# Patient Record
Sex: Female | Born: 1975 | Race: Asian | Hispanic: No | Marital: Married | Smoking: Never smoker
Health system: Southern US, Community
[De-identification: ages and names within clinical notes are randomized; demographics above are authoritative.]

## PROBLEM LIST (undated history)

## (undated) DIAGNOSIS — M6740 Ganglion, unspecified site: Secondary | ICD-10-CM

## (undated) DIAGNOSIS — T7840XA Allergy, unspecified, initial encounter: Secondary | ICD-10-CM

## (undated) HISTORY — DX: Ganglion, unspecified site: M67.40

## (undated) HISTORY — PX: OTHER SURGICAL HISTORY: SHX169

## (undated) HISTORY — DX: Allergy, unspecified, initial encounter: T78.40XA

---

## 2000-06-20 ENCOUNTER — Encounter: Admission: RE | Admit: 2000-06-20 | Discharge: 2000-06-20 | Payer: Self-pay | Admitting: Family Medicine

## 2000-07-23 ENCOUNTER — Encounter: Admission: RE | Admit: 2000-07-23 | Discharge: 2000-07-23 | Payer: Self-pay | Admitting: Family Medicine

## 2001-05-23 ENCOUNTER — Emergency Department (HOSPITAL_COMMUNITY): Admission: EM | Admit: 2001-05-23 | Discharge: 2001-05-23 | Payer: Self-pay | Admitting: Emergency Medicine

## 2001-06-21 ENCOUNTER — Encounter: Admission: RE | Admit: 2001-06-21 | Discharge: 2001-06-21 | Payer: Self-pay | Admitting: Family Medicine

## 2001-07-23 ENCOUNTER — Encounter: Admission: RE | Admit: 2001-07-23 | Discharge: 2001-07-23 | Payer: Self-pay | Admitting: Family Medicine

## 2002-03-21 ENCOUNTER — Other Ambulatory Visit: Admission: RE | Admit: 2002-03-21 | Discharge: 2002-03-21 | Payer: Self-pay | Admitting: Obstetrics and Gynecology

## 2003-04-17 ENCOUNTER — Other Ambulatory Visit: Admission: RE | Admit: 2003-04-17 | Discharge: 2003-04-17 | Payer: Self-pay | Admitting: Obstetrics and Gynecology

## 2004-05-13 ENCOUNTER — Other Ambulatory Visit: Admission: RE | Admit: 2004-05-13 | Discharge: 2004-05-13 | Payer: Self-pay | Admitting: Obstetrics and Gynecology

## 2006-08-20 ENCOUNTER — Emergency Department (HOSPITAL_COMMUNITY): Admission: EM | Admit: 2006-08-20 | Discharge: 2006-08-20 | Payer: Self-pay | Admitting: Emergency Medicine

## 2006-08-21 ENCOUNTER — Ambulatory Visit (HOSPITAL_COMMUNITY): Admission: RE | Admit: 2006-08-21 | Discharge: 2006-08-21 | Payer: Self-pay | Admitting: Emergency Medicine

## 2006-11-27 ENCOUNTER — Inpatient Hospital Stay (HOSPITAL_COMMUNITY): Admission: AD | Admit: 2006-11-27 | Discharge: 2006-11-27 | Payer: Self-pay | Admitting: Obstetrics & Gynecology

## 2006-12-06 ENCOUNTER — Inpatient Hospital Stay (HOSPITAL_COMMUNITY): Admission: AD | Admit: 2006-12-06 | Discharge: 2006-12-09 | Payer: Self-pay | Admitting: Obstetrics and Gynecology

## 2012-07-18 ENCOUNTER — Telehealth: Payer: Self-pay

## 2012-07-18 ENCOUNTER — Ambulatory Visit (INDEPENDENT_AMBULATORY_CARE_PROVIDER_SITE_OTHER): Payer: BC Managed Care – PPO | Admitting: Family Medicine

## 2012-07-18 VITALS — BP 93/59 | HR 69 | Temp 97.9°F | Resp 18 | Ht 60.0 in | Wt 133.0 lb

## 2012-07-18 DIAGNOSIS — M6740 Ganglion, unspecified site: Secondary | ICD-10-CM

## 2012-07-18 NOTE — Telephone Encounter (Signed)
Please call husband's cell # [phone] with referral info when it is set up.

## 2012-07-18 NOTE — Progress Notes (Signed)
[facility] [address], [place] [address] [phone]  Date:  07/18/2012   Name:  Taylor Sanders   DOB:  [birth year]   MRN:  [id number]  PCP:  No primary provider on file.    Chief Complaint: Cyst   History of Present Illness:  Taylor Sanders is a 36 y.o. very pleasant female patient who presents with the following:  She has noted a ganglion cyst on her left dorsal wrist for several months.  This was present in the past and went away- however it has returned.  There is no pain, no other issues today.    There is no problem list on file for this patient.   No past medical history on file.  No past surgical history on file.  History  Substance Use Topics  . Smoking status: Never Smoker   . Smokeless tobacco: Not on file  . Alcohol Use: Not on file    No family history on file.  No Known Allergies  Medication list has been reviewed and updated.  Current Outpatient Prescriptions on File Prior to Visit  Medication Sig Dispense Refill  . norethindrone-ethinyl estradiol (MICROGESTIN,JUNEL,LOESTRIN) 1-20 MG-MCG tablet Take 1 tablet by mouth daily.        Review of Systems:  As per HPI- otherwise negative.   Physical Examination: Filed Vitals:   07/18/12 1244  BP: 93/59  Pulse: 69  Temp: 97.9 F (36.6 C)  Resp: 18   Filed Vitals:   07/18/12 1244  Height: 5' (1.524 m)  Weight: 133 lb (60.328 kg)   Body mass index is 25.97 kg/(m^2). Ideal Body Weight: Weight in (lb) to have BMI = 25: 127.7    GEN: WDWN, NAD, Non-toxic, Alert & Oriented x 3 HEENT: Atraumatic, Normocephalic.  Ears and Nose: No external deformity. EXTR: No clubbing/cyanosis/edema NEURO: Normal gait.  PSYCH: Normally interactive. Conversant. Not depressed or anxious appearing.  Calm demeanor.  Left dorsal wrist: medium sized ganglion cyst, about 2cm in diameter.  Firm, mobile- typical ganglion.  No heat or redness, good ROM, no tenderness.    Assessment and Plan: 1.  Ganglion cyst    Referral to ortho/ sports med for treatment of cyst.  Did not charge for today's visit.  Will make referral for her.   Abbe Amsterdam, MD

## 2012-09-03 ENCOUNTER — Ambulatory Visit (INDEPENDENT_AMBULATORY_CARE_PROVIDER_SITE_OTHER): Payer: BC Managed Care – PPO | Admitting: Family Medicine

## 2012-09-03 VITALS — BP 92/60 | HR 55 | Temp 97.8°F | Resp 16 | Ht 60.0 in | Wt 123.0 lb

## 2012-09-03 DIAGNOSIS — M549 Dorsalgia, unspecified: Secondary | ICD-10-CM

## 2012-09-03 DIAGNOSIS — O2390 Unspecified genitourinary tract infection in pregnancy, unspecified trimester: Secondary | ICD-10-CM

## 2012-09-03 DIAGNOSIS — R350 Frequency of micturition: Secondary | ICD-10-CM

## 2012-09-03 DIAGNOSIS — O2340 Unspecified infection of urinary tract in pregnancy, unspecified trimester: Secondary | ICD-10-CM

## 2012-09-03 DIAGNOSIS — N390 Urinary tract infection, site not specified: Secondary | ICD-10-CM

## 2012-09-03 LAB — POCT UA - MICROSCOPIC ONLY
Casts, Ur, LPF, POC: NEGATIVE
Crystals, Ur, HPF, POC: NEGATIVE
Mucus, UA: NEGATIVE
Yeast, UA: NEGATIVE

## 2012-09-03 LAB — POCT URINALYSIS DIPSTICK
Bilirubin, UA: NEGATIVE
Blood, UA: NEGATIVE
Glucose, UA: NEGATIVE
Ketones, UA: NEGATIVE
Nitrite, UA: NEGATIVE
Protein, UA: NEGATIVE
Spec Grav, UA: 1.015
Urobilinogen, UA: 0.2
pH, UA: 7.5

## 2012-09-03 MED ORDER — NITROFURANTOIN MONOHYD MACRO 100 MG PO CAPS: 100.0000 mg | ORAL_CAPSULE | Freq: Two times a day (BID) | ORAL | Status: DC

## 2012-09-03 NOTE — Progress Notes (Signed)
 [facility]:  Office Visit  Chief Complaint:  Chief Complaint  Patient presents with  . Back Pain    x 3 days  . Urinary Frequency    HPI: Taylor Sanders is a 36 y.o. female who complains of 3 days of LBP bilaterally especially with bending over  No dysuria. Vaginal itching. + Urinary retention.   Past Medical History  Diagnosis Date  . Ganglion cyst    Past Surgical History  Procedure Date  . Ganglion cyst aspiration    History   Social History  . Marital Status: Married    Spouse Name: N/A    Number of Children: N/A  . Years of Education: N/A   Social History Main Topics  . Smoking status: Never Smoker   . Smokeless tobacco: None  . Alcohol Use: No  . Drug Use: No  . Sexually Active: None   Other Topics Concern  . None   Social History Narrative  . None   Family History  Problem Relation Age of Onset  . Arthritis Mother    No Known Allergies Prior to Admission medications   Medication Sig Start Date End Date Taking? Authorizing Provider  norethindrone-ethinyl estradiol (MICROGESTIN,JUNEL,LOESTRIN) 1-20 MG-MCG tablet Take 1 tablet by mouth daily.    Historical Provider, MD     ROS: The patient denies fevers, chills, night sweats, unintentional weight loss, chest pain, palpitations, wheezing, dyspnea on exertion, nausea, vomiting, abdominal pain, dysuria, hematuria, melena, numbness, weakness, or tingling.   All other systems have been reviewed and were otherwise negative with the exception of those mentioned in the HPI and as above.    PHYSICAL EXAM: Filed Vitals:   09/03/12 0814  BP: 92/60  Pulse: 55  Temp: 97.8 F (36.6 C)  Resp: 16   Filed Vitals:   09/03/12 0814  Height: 5' (1.524 m)  Weight: 123 lb (55.792 kg)   Body mass index is 24.02 kg/(m^2).  General: Alert, no acute distress HEENT:  Normocephalic, atraumatic, oropharynx patent.  Cardiovascular:  Regular rate and rhythm, no rubs murmurs or gallops.  No Carotid  bruits, radial pulse intact. No pedal edema.  Respiratory: Clear to auscultation bilaterally.  No wheezes, rales, or rhonchi.  No cyanosis, no use of accessory musculature GI: No organomegaly, abdomen is soft and non-tender, positive bowel sounds.  No masses. Skin: No rashes. Neurologic: Facial musculature symmetric. Psychiatric: Patient is appropriate throughout our interaction. Lymphatic: No cervical lymphadenopathy Musculoskeletal: Gait intact. Full ROM. 5/5 strn, sensationitnact, 2/2 DTR. No CVA tenderness   LABS: Results for orders placed in visit on 09/03/12  POCT URINALYSIS DIPSTICK      Component Value Range   Color, UA yellow     Clarity, UA hazy     Glucose, UA neg     Bilirubin, UA neg     Ketones, UA neg     Spec Grav, UA 1.015     Blood, UA neg     pH, UA 7.5     Protein, UA neg     Urobilinogen, UA 0.2     Nitrite, UA neg     Leukocytes, UA Trace    POCT UA - MICROSCOPIC ONLY      Component Value Range   WBC, Ur, HPF, POC 4-6     RBC, urine, microscopic 1-3     Bacteria, U Microscopic trace     Mucus, UA neg     Epithelial cells, urine per micros 2-3  Crystals, Ur, HPF, POC neg     Casts, Ur, LPF, POC neg     Yeast, UA neg       EKG/XRAY:   Primary read interpreted by Dr. Conley Rolls at [facility].   ASSESSMENT/PLAN: Encounter Diagnoses  Name Primary?  . Urinary frequency Yes  . Back pain    UTI-Rx Macrobid, Culture urine Push fluids If sxs do not improve with abx then consider other cause of back pain which may bot be related to UTI. She works as a Lawyer so maybe relaed to that.    ,  PHUONG, DO 09/03/2012 9:08 AM

## 2012-09-05 LAB — URINE CULTURE: Colony Count: 5000

## 2013-03-27 ENCOUNTER — Ambulatory Visit (INDEPENDENT_AMBULATORY_CARE_PROVIDER_SITE_OTHER): Payer: BC Managed Care – PPO | Admitting: Family Medicine

## 2013-03-27 VITALS — BP 96/60 | HR 69 | Temp 98.4°F | Resp 16 | Ht 60.0 in | Wt 137.0 lb

## 2013-03-27 DIAGNOSIS — M62838 Other muscle spasm: Secondary | ICD-10-CM

## 2013-03-27 DIAGNOSIS — M25519 Pain in unspecified shoulder: Secondary | ICD-10-CM

## 2013-03-27 DIAGNOSIS — M542 Cervicalgia: Secondary | ICD-10-CM

## 2013-03-27 DIAGNOSIS — M25511 Pain in right shoulder: Secondary | ICD-10-CM

## 2013-03-27 MED ORDER — MELOXICAM 7.5 MG PO TABS: 7.5000 mg | ORAL_TABLET | Freq: Every day | ORAL | Status: DC

## 2013-03-27 MED ORDER — CYCLOBENZAPRINE HCL 5 MG PO TABS: ORAL_TABLET | ORAL | Status: DC

## 2013-03-27 NOTE — Progress Notes (Signed)
Subjective:    Patient ID: Taylor Sanders, female    DOB: 1975-12-30, 37 y.o.   MRN: [id number]  HPI Taylor Sanders is a 37 y.o. female Started 6 days ago with R shoulder pain - neck to down R arm.  Initially slight soreness - 6 days ago.  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  Same job past year. Pain radiated down arm few days ago.  Radiated down arm 2 days ago. Only in shoulder today. No dropping of objects, no weakness.   Tx: tylenol, then alleve once.    Review of Systems  Constitutional: Negative for fever and chills.  Musculoskeletal: Positive for myalgias and arthralgias. Negative for joint swelling.  Skin: Negative for color change and rash.  Neurological: Negative for weakness.       Objective:   Physical Exam  Vitals reviewed. Constitutional: She is oriented to person, place, and time. She appears well-developed and well-nourished.  Pulmonary/Chest: Effort normal.  Musculoskeletal:       Right shoulder: She exhibits normal range of motion, no tenderness, no bony tenderness and normal strength (full rtc stregth, negative empty can,, neer, hawkins kennedy. ).       Cervical back: She exhibits decreased range of motion (decreased extension and L rotation - l rotation and lateral flexion reproduces neck sx's.  ), tenderness and spasm (L paraspinal  and trapezius spasm. ). She exhibits no swelling.       Back:  Neurological: She is alert and oriented to person, place, and time. She has normal strength. No sensory deficit.  Reflex Scores:      Tricep reflexes are 2+ on the right side and 2+ on the left side.      Bicep reflexes are 2+ on the right side and 2+ on the left side.      Brachioradialis reflexes are 2+ on the right side and 2+ on the left side. Full/equal strength of UE's bilaterally. nvi distally.  Strength intact in hand.   Skin: Skin is warm and dry. No rash noted.  Psychiatric:  She has a normal mood and affect. Her behavior is normal.      Assessment & Plan:  Taylor Sanders is a 37 y.o. female Neck pain - Plan: cyclobenzaprine (FLEXERIL) 5 MG tablet, meloxicam (MOBIC) 7.5 MG tablet  Pain in joint, shoulder region, right - Plan: meloxicam (MOBIC) 7.5 MG tablet  Muscle spasms of neck - Plan: cyclobenzaprine (FLEXERIL) 5 MG tablet, meloxicam (MOBIC) 7.5 MG tablet  likely spasm of R paraspinals vs HNP.  Cervicogenic source likely (?C7), with repetitive activity at work.  Nki. Deferred xray at present.  Strength intact distally. Trial of mobic, flexeril qhs prn - sed, neck care manual, adjust to using l arm at work and recheck in next week if not improving. understanding expressed.   Meds ordered this encounter           . cyclobenzaprine (FLEXERIL) 5 MG tablet    Sig: 1 pill by mouth up to every 8 hours as needed. Start with one pill by mouth each bedtime as needed due to sedation    Dispense:  15 tablet    Refill:  0  . meloxicam (MOBIC) 7.5 MG tablet    Sig: Take 1 tablet (7.5 mg total) by mouth daily.    Dispense:  30 tablet    Refill:  0     Patient Instructions  Heat or ice to sore area, stretches as in neck care manual. Try to use other hand/arm with work. meloxicam once per day, then muscle relaxant at night if needed. Ok to take tylenol as well if needed. If not improving in next week or worse sooner - return for recheck.

## 2013-03-27 NOTE — Patient Instructions (Addendum)
Heat or ice to sore area, stretches as in neck care manual. Try to use other hand/arm with work. meloxicam once per day, then muscle relaxant at night if needed. Ok to take tylenol as well if needed. If not improving in next week or worse sooner - return for recheck.

## 2013-04-23 ENCOUNTER — Other Ambulatory Visit: Payer: Self-pay | Admitting: Family Medicine

## 2013-04-23 DIAGNOSIS — M542 Cervicalgia: Secondary | ICD-10-CM

## 2013-04-23 NOTE — Telephone Encounter (Signed)
Dr Neva Seat, do you want to give pt any RFs of Mobic?

## 2013-04-24 NOTE — Telephone Encounter (Signed)
LMOM on home # that #15 tabs were sent to pharmacy but needs OV.

## 2013-04-24 NOTE — Telephone Encounter (Signed)
#  15 given, but last seen in April, and instructed to follow up in 1 week if not improving.  If still symptomatic - needs to follow up to recheck.

## 2013-05-18 ENCOUNTER — Other Ambulatory Visit: Payer: Self-pay | Admitting: Family Medicine

## 2013-05-18 DIAGNOSIS — M542 Cervicalgia: Secondary | ICD-10-CM

## 2013-05-19 NOTE — Telephone Encounter (Signed)
Dr Neva Seat, do you want to RF this, or see pt first for re-eval?

## 2013-05-22 NOTE — Telephone Encounter (Signed)
Notified Rx was sent and needs to RTC if more needed to re-eval.

## 2013-05-22 NOTE — Telephone Encounter (Signed)
Call and check status.  I last saw her in April and plan to follow up in 1 week at that time if not improving. If still having pain, can refill Mobic (already sent in), but needs recheck ov to determine course.

## 2014-06-05 ENCOUNTER — Ambulatory Visit (INDEPENDENT_AMBULATORY_CARE_PROVIDER_SITE_OTHER): Payer: PRIVATE HEALTH INSURANCE | Admitting: Family Medicine

## 2014-06-05 VITALS — BP 118/70 | HR 60 | Temp 98.1°F | Resp 16 | Ht 59.75 in | Wt 137.6 lb

## 2014-06-05 DIAGNOSIS — B070 Plantar wart: Secondary | ICD-10-CM

## 2014-06-05 NOTE — Progress Notes (Signed)
° °  Subjective:    Patient ID: Taylor Sanders, female    DOB: 08/17/1976, 38 y.o.   MRN: [id number]  Foot Injury    Chief Complaint  Patient presents with   Foot Injury    bottom of right callus   This chart was scribed for Elvina SidleKurt Lauenstein, MD by Andrew Auaven Small, ED Scribe. This patient was seen in room 10 and the patient's care was started at 4:06 PM.  HPI Comments: Taylor Sanders is a 38 y.o. female who presents to the [facility] complaining of worsening callus to bottom of right foot x over 2 month. Pt has pain walking feeling as if something is pinching her foot. States she has been picking at area, making a hole in callus to alleviate pressure.   There are no active problems to display for this patient.  Past Medical History  Diagnosis Date   Ganglion cyst    No Known Allergies Prior to Admission medications   Medication Sig Start Date End Date Taking? Authorizing Provider  cyclobenzaprine (FLEXERIL) 5 MG tablet 1 pill by mouth up to every 8 hours as needed. Start with one pill by mouth each bedtime as needed due to sedation 03/27/13   Shade FloodJeffrey R Greene, MD  loratadine (CLARITIN) 10 MG tablet Take 10 mg by mouth daily.    Historical Provider, MD  meloxicam (MOBIC) 7.5 MG tablet TAKE 1 TABLET BY MOUTH DAILY 05/18/13   Shade FloodJeffrey R Greene, MD  nitrofurantoin, macrocrystal-monohydrate, (MACROBID) 100 MG capsule Take 1 capsule (100 mg total) by mouth 2 (two) times daily. 09/03/12   Thao P Le, DO  norethindrone-ethinyl estradiol (MICROGESTIN,JUNEL,LOESTRIN) 1-20 MG-MCG tablet Take 1 tablet by mouth daily.    Historical Provider, MD    Review of Systems  Skin: Negative for rash and wound.       Objective:   Physical Exam  Nursing note and vitals reviewed. Constitutional: She is oriented to person, place, and time. She appears well-developed and well-nourished. No distress.  HENT:  Head: Normocephalic and atraumatic.  Eyes: Conjunctivae and EOM are normal.  Neck: Neck  supple. No tracheal deviation present.  Cardiovascular: Normal rate.   Pulmonary/Chest: Effort normal. No respiratory distress.  Musculoskeletal: Normal range of motion.  Neurological: She is alert and oriented to person, place, and time.  Skin: Skin is warm and dry.  .5 cm planter wart to bottom of right foot.  Psychiatric: She has a normal mood and affect. Her behavior is normal.  The wart was carefully debrided after sterile alcohol prep and then liquid N2 was used to treat the wart     Assessment & Plan:   Plantar wart of right foot Recheck 2 week if not resolved Signed, Elvina SidleKurt Lauenstein, MD

## 2015-02-15 ENCOUNTER — Ambulatory Visit (INDEPENDENT_AMBULATORY_CARE_PROVIDER_SITE_OTHER): Payer: BLUE CROSS/BLUE SHIELD | Admitting: Internal Medicine

## 2015-02-15 VITALS — BP 90/70 | HR 82 | Temp 99.8°F | Resp 18 | Wt 137.0 lb

## 2015-02-15 DIAGNOSIS — J111 Influenza due to unidentified influenza virus with other respiratory manifestations: Secondary | ICD-10-CM | POA: Diagnosis not present

## 2015-02-15 MED ORDER — HYDROCODONE-HOMATROPINE 5-1.5 MG/5ML PO SYRP: 5.0000 mL | ORAL_SOLUTION | Freq: Four times a day (QID) | ORAL | Status: DC | PRN

## 2015-02-15 NOTE — Progress Notes (Signed)
This chart was scribed for [name], MD by Luisa Dago, ED Scribe. This patient was seen in room 5 and the patient's care was started at 8:40 PM.  Subjective:    Patient ID: Taylor Sanders, female    DOB: 03-30-1976, 39 y.o.   MRN: [id number]  Chief Complaint  Patient presents with  . Sinusitis    HPI Taylor Sanders is a 39 y.o. female with no pertinent medical history noted below, presents to the office complaining of sudden onset sinusitis that started approximately 3 days ago. Pt is also complaining of associated sinus pressure, HA, subjective fever, chills, cough, sore throat, and congestion. Current office temperature was measured to be 99.8. She endorses multiple sick contacts, Husband and daughter. Husband states that he was not seen by his provider when her symptoms started, but he is getting better. Daughter is recovering as well. She endorses doing a couple of nebulizer treatments at home and using her albuterol inhaler with not relief. Pt also admits to several episodes of diarrhea yesterday. Pt denies any neck pain, otalgia, visual disturbance, CP, SOB, abdominal pain, nausea, emesis,  urinary symptoms, back pain, weakness, numbness and rash as associated symptoms.    There are no active problems to display for this patient.  Past Medical History  Diagnosis Date  . Ganglion cyst    Past Surgical History  Procedure Laterality Date  . Ganglion cyst aspiration     No Known Allergies Prior to Admission medications   Not on File   History   Social History  . Marital Status: Married    Spouse Name: Taylor Sanders  . Number of Children: Taylor Sanders  . Years of Education: Taylor Sanders   Occupational History  . Not on file.   Social History Main Topics  . Smoking status: Never Smoker   . Smokeless tobacco: Not on file  . Alcohol Use: No  . Drug Use: No  . Sexual Activity: Not on file   Other Topics Concern  . Not on file   Social History Narrative   Review of Systems  Constitutional:  Positive for fever and chills.  HENT: Positive for congestion and sore throat. Negative for ear pain, rhinorrhea, sinus pressure, trouble swallowing and voice change.   Eyes: Negative for discharge.  Respiratory: Positive for cough. Negative for shortness of breath, wheezing and stridor.   Cardiovascular: Negative for chest pain.  Gastrointestinal: Positive for diarrhea. Negative for nausea, vomiting and abdominal pain.  Genitourinary: Negative.  Negative for dysuria and hematuria.  Skin: Negative for rash.  Neurological: Positive for headaches. Negative for dizziness, weakness, light-headedness and numbness.   Objective:   Physical Exam  Constitutional: She is oriented to person, place, and time. She appears well-developed and well-nourished. No distress.  HENT:  Head: Normocephalic and atraumatic.  Right Ear: External ear normal.  Left Ear: External ear normal.  Nose: Nose normal.  Mouth/Throat: Oropharynx is clear and moist.  Eyes: Conjunctivae and EOM are normal. Pupils are equal, round, and reactive to light.  Neck: Normal range of motion. Neck supple. No thyromegaly present.  Cardiovascular: Normal rate and regular rhythm.  Exam reveals no gallop and no friction rub.   No murmur heard. Pulmonary/Chest: Effort normal and breath sounds normal. No respiratory distress. She has no wheezes. She has no rales.  Musculoskeletal: Normal range of motion.  Neurological: She is alert and oriented to person, place, and time.  Skin: Skin is warm and dry.  Psychiatric: She has a normal mood and affect.  Her behavior is normal.  Nursing note and vitals reviewed.  BP 90/70 mmHg  Pulse 82  Temp(Src) 99.8 F (37.7 C) (Oral)  Resp 18  Wt 137 lb (62.143 kg)  SpO2 100%  LMP 01/27/2015   Assessment & Plan:  Influenza Meds ordered this encounter  Medications  . HYDROcodone-homatropine (HYCODAN) 5-1.5 MG/5ML syrup    Sig: Take 5 mLs by mouth every 6 (six) hours as needed.    Dispense:  120  mL    Refill:  0   Sudafed 12 hour twice a day 3 days Tylenol Fluid and bedrest   I have completed the patient encounter in its entirety as documented by the scribe, with editing by me where necessary. Elieser Tetrick P. Merla Richesoolittle, M.D.

## 2015-02-15 NOTE — Patient Instructions (Signed)
Ask the pharmacist for 12 hour Sudafed to take twice a day for the next 3-4 days Tylenol or Advil for fever Bedrest and fluids until well

## 2015-08-02 ENCOUNTER — Ambulatory Visit (INDEPENDENT_AMBULATORY_CARE_PROVIDER_SITE_OTHER): Payer: BLUE CROSS/BLUE SHIELD

## 2015-08-02 ENCOUNTER — Ambulatory Visit (INDEPENDENT_AMBULATORY_CARE_PROVIDER_SITE_OTHER): Payer: BLUE CROSS/BLUE SHIELD | Admitting: Family Medicine

## 2015-08-02 VITALS — BP 110/70 | HR 62 | Temp 98.5°F | Ht 59.5 in | Wt 139.5 lb

## 2015-08-02 DIAGNOSIS — H11001 Unspecified pterygium of right eye: Secondary | ICD-10-CM | POA: Diagnosis not present

## 2015-08-02 DIAGNOSIS — M25531 Pain in right wrist: Secondary | ICD-10-CM | POA: Diagnosis not present

## 2015-08-02 DIAGNOSIS — G8929 Other chronic pain: Secondary | ICD-10-CM

## 2015-08-02 MED ORDER — DICLOFENAC SODIUM 75 MG PO TBEC: 75.0000 mg | DELAYED_RELEASE_TABLET | Freq: Two times a day (BID) | ORAL | Status: DC

## 2015-08-02 NOTE — Progress Notes (Signed)
  Subjective:  Patient ID: Taylor Sanders, female    DOB: [birth year]  Age: 39 y.o. MRN: [id number]  Patient is here with 2 problems. Her right wrist has been hurting her. She works at [employer] all day and her wrists is been paining a lot. She has had some pain in the past, but lately it is been bothering her much more.  She also has a pterygium on her right eye which causes a little dryness and irritation. Would like some eye ointment again for that. She has seen an eye doctor in the past, Dr. Hazle Quant.   Objective:   No major distress. Prominent pterygium right eye, not inflamed looking  Right wrist is tender right at the midportion of the wrist joint. No real pain with flexion or extension or gripping right now.  [facility] reading (PRIMARY) by  Dr. Alwyn Ren Normal wrist.    Assessment & Plan:   Assessment:  Chronic pain/strain causing acute and chronic wrist pain Pterygium  Plan:  Wrist splint See instructions about eyes  Patient Instructions  Try using some over-the-counter Refresh Lacri-Lube or Refresh Liquigel for the dry eyes. This is called a pterygium, and if it gets too much trouble you can go back and see the eye doctor or we will refer you if needed.  Wear the wrist splint  Take the diclofenac one twice daily for pain and inflammation  Ask your boss if there is some other job you could do for a few days to let you're wrist to rest  If not improving over the next couple of weeks please come back    Taylor Breeding, MD 08/02/2015

## 2015-08-02 NOTE — Patient Instructions (Addendum)
Try using some over-the-counter Refresh Lacri-Lube or Refresh Liquigel for the dry eyes. This is called a pterygium, and if it gets too much trouble you can go back and see the eye doctor or we will refer you if needed.  Wear the wrist splint  Take the diclofenac one twice daily for pain and inflammation  Ask your boss if there is some other job you could do for a few days to let you're wrist to rest  If not improving over the next couple of weeks please come back

## 2016-02-18 ENCOUNTER — Ambulatory Visit (INDEPENDENT_AMBULATORY_CARE_PROVIDER_SITE_OTHER): Payer: BLUE CROSS/BLUE SHIELD | Admitting: Family Medicine

## 2016-02-18 VITALS — BP 102/70 | HR 86 | Temp 98.6°F | Resp 16 | Ht 59.0 in | Wt 141.8 lb

## 2016-02-18 DIAGNOSIS — R059 Cough, unspecified: Secondary | ICD-10-CM

## 2016-02-18 DIAGNOSIS — R0981 Nasal congestion: Secondary | ICD-10-CM | POA: Diagnosis not present

## 2016-02-18 DIAGNOSIS — R05 Cough: Secondary | ICD-10-CM | POA: Diagnosis not present

## 2016-02-18 LAB — POCT INFLUENZA A/B
INFLUENZA A, POC: NEGATIVE
Influenza B, POC: NEGATIVE

## 2016-02-18 NOTE — Progress Notes (Signed)
Subjective:    Patient ID: Taylor Sanders, female    DOB: [birth year], 40 y.o.   MRN: [id number]  HPI Taylor Sanders is a 40 y.o. female  Burning in eyes, nasal congestion and runny nose, cough, eyes watering.   Felt warm last night and today, but did not measure a fever. No flu vaccine this season. No known sick contacts.  Itching in R ear.   Attempted treatment - claritin last night and theraflu - no relief.   No body aches, no headache.   Not usually allergy sufferer - occasional allergy symptoms.   There are no active problems to display for this patient.  Past Medical History  Diagnosis Date  . Ganglion cyst   . Allergy    Past Surgical History  Procedure Laterality Date  . Ganglion cyst aspiration     No Known Allergies Prior to Admission medications   Medication Sig Start Date End Date Taking? Authorizing Provider  diclofenac (VOLTAREN) 75 MG EC tablet Take 1 tablet (75 mg total) by mouth 2 (two) times daily. Patient not taking: Reported on 02/18/2016 08/02/15   Peyton Najjar, MD  HYDROcodone-homatropine [facility]) 5-1.5 MG/5ML syrup Take 5 mLs by mouth every 6 (six) hours as needed. Patient not taking: Reported on 08/02/2015 02/15/15   Tonye Pearson, MD   Social History   Social History  . Marital Status: Married    Spouse Name: N/A  . Number of Children: N/A  . Years of Education: N/A   Occupational History  . Not on file.   Social History Main Topics  . Smoking status: Never Smoker   . Smokeless tobacco: Not on file  . Alcohol Use: No  . Drug Use: No  . Sexual Activity: Not on file   Other Topics Concern  . Not on file   Social History Narrative      Review of Systems  Constitutional: Negative for fever (objective warm sensation only no objective fever.) and chills.  HENT: Positive for congestion, ear pain (Slight right discomfort) and rhinorrhea.   Eyes: Positive for discharge and itching. Negative for visual disturbance.  Respiratory: Positive for  cough. Negative for shortness of breath.   Musculoskeletal: Negative for myalgias and arthralgias.       Objective:   Physical Exam  Constitutional: She is oriented to person, place, and time. She appears well-developed and well-nourished. No distress.  HENT:  Head: Normocephalic and atraumatic.  Right Ear: Hearing, tympanic membrane, external ear and ear canal normal.  Left Ear: Hearing, tympanic membrane, external ear and ear canal normal.  Nose: Nose normal.  Mouth/Throat: Oropharynx is clear and moist. No oropharyngeal exudate.  Slight edematous turbinates bilaterally  Eyes: Conjunctivae and EOM are normal. Pupils are equal, round, and reactive to light.  Cardiovascular: Normal rate, regular rhythm, normal heart sounds and intact distal pulses.   No murmur heard. Pulmonary/Chest: Effort normal and breath sounds normal. No respiratory distress. She has no wheezes. She has no rhonchi.  Neurological: She is alert and oriented to person, place, and time.  Skin: Skin is warm and dry. No rash noted.  Psychiatric: She has a normal mood and affect. Her behavior is normal.  Vitals reviewed.  Filed Vitals:   02/18/16 1852  BP: 102/70  Pulse: 86  Temp: 98.6 F (37 C)  TempSrc: Oral  Resp: 16  Height:  (1.499 m)  Weight: 141 lb 12.8 oz (64.32 kg)  SpO2: 99%       Assessment &  Plan:  Taylor Sanders is a 40 y.o. female Cough - Plan: POCT Influenza A/B  Nasal congestion - Plan: POCT Influenza A/B  Likely allergic rhinitis, less likely upper respiratory infection/viral infection.    - trial of flonase ns, claritin otc, sx care and rtc precautions.   No orders of the defined types were placed in this encounter.   Patient Instructions  IF you received an x-ray today, you will receive an invoice from [facility]. Please contact [facility] at [phone] with questions or concerns regarding your invoice.   IF you received labwork today, you will receive an  invoice from [employer]. Please contact [facility] at [phone] with questions or concerns regarding your invoice.   Our billing staff will not be able to assist you with questions regarding bills from these companies.  You will be contacted with the lab results as soon as they are available. The fastest way to get your results is to activate your My Chart account. Instructions are located on the last page of this paperwork. If you have not heard from [place] regarding the results in 2 weeks, please contact this office.   Your flu test was negative, your symptoms appear to be due either to a virus, or more likely allergies with the burning eyes and sneezing.   For allergies, see information below, but you can take Flonase nasal spray over-the-counter 1-2 sprays per nostril each day, and continue Claritin once per day.  You can take Mucinex as needed for cough, drink plenty of fluids and rest. Return to the clinic or go to the nearest emergency room if any of your symptoms worsen or new symptoms occur.   Allergic Rhinitis Allergic rhinitis is when the mucous membranes in the nose respond to allergens. Allergens are particles in the air that cause your body to have an allergic reaction. This causes you to release allergic antibodies. Through a chain of events, these eventually cause you to release histamine into the blood stream. Although meant to protect the body, it is this release of histamine that causes your discomfort, such as frequent sneezing, congestion, and an itchy, runny nose.  CAUSES Seasonal allergic rhinitis (hay fever) is caused by pollen allergens that may come from grasses, trees, and weeds. Year-round allergic rhinitis (perennial allergic rhinitis) is caused by allergens such as house dust mites, pet dander, and mold spores. SYMPTOMS  Nasal stuffiness (congestion).  Itchy, runny nose with sneezing and tearing of the eyes. DIAGNOSIS Your health care  provider can help you determine the allergen or allergens that trigger your symptoms. If you and your health care provider are unable to determine the allergen, skin or blood testing may be used. Your health care provider will diagnose your condition after taking your health history and performing a physical exam. Your health care provider may assess you for other related conditions, such as asthma, pink eye, or an ear infection. TREATMENT Allergic rhinitis does not have a cure, but it can be controlled by:  Medicines that block allergy symptoms. These may include allergy shots, nasal sprays, and oral antihistamines.  Avoiding the allergen. Hay fever may often be treated with antihistamines in pill or nasal spray forms. Antihistamines block the effects of histamine. There are over-the-counter medicines that may help with nasal congestion and swelling around the eyes. Check with your health care provider before taking or giving this medicine. If avoiding the allergen or the medicine prescribed do not work, there are many new medicines your health  care provider can prescribe. Stronger medicine may be used if initial measures are ineffective. Desensitizing injections can be used if medicine and avoidance does not work. Desensitization is when a patient is given ongoing shots until the body becomes less sensitive to the allergen. Make sure you follow up with your health care provider if problems continue. HOME CARE INSTRUCTIONS It is not possible to completely avoid allergens, but you can reduce your symptoms by taking steps to limit your exposure to them. It helps to know exactly what you are allergic to so that you can avoid your specific triggers. SEEK MEDICAL CARE IF:  You have a fever.  You develop a cough that does not stop easily (persistent).  You have shortness of breath.  You start wheezing.  Symptoms interfere with normal daily activities.   This information is not intended to replace  advice given to you by your health care provider. Make sure you discuss any questions you have with your health care provider.   Document Released: 08/22/2001 Document Revised: 12/18/2014 Document Reviewed: 08/04/2013 Elsevier Interactive Patient Education [employer]2016 Elsevier Inc.     I personally performed the services described in this documentation, which was scribed in my presence. The recorded information has been reviewed and considered, and addended by me as needed.

## 2016-02-18 NOTE — Patient Instructions (Addendum)
IF you received an x-ray today, you will receive an invoice from [facility]. Please contact [facility] at [phone] with questions or concerns regarding your invoice.   IF you received labwork today, you will receive an invoice from [employer]. Please contact [facility] at [phone] with questions or concerns regarding your invoice.   Our billing staff will not be able to assist you with questions regarding bills from these companies.  You will be contacted with the lab results as soon as they are available. The fastest way to get your results is to activate your My Chart account. Instructions are located on the last page of this paperwork. If you have not heard from [place] regarding the results in 2 weeks, please contact this office.   Your flu test was negative, your symptoms appear to be due either to a virus, or more likely allergies with the burning eyes and sneezing.   For allergies, see information below, but you can take Flonase nasal spray over-the-counter 1-2 sprays per nostril each day, and continue Claritin once per day.  You can take Mucinex as needed for cough, drink plenty of fluids and rest. Return to the clinic or go to the nearest emergency room if any of your symptoms worsen or new symptoms occur.   Allergic Rhinitis Allergic rhinitis is when the mucous membranes in the nose respond to allergens. Allergens are particles in the air that cause your body to have an allergic reaction. This causes you to release allergic antibodies. Through a chain of events, these eventually cause you to release histamine into the blood stream. Although meant to protect the body, it is this release of histamine that causes your discomfort, such as frequent sneezing, congestion, and an itchy, runny nose.  CAUSES Seasonal allergic rhinitis (hay fever) is caused by pollen allergens that may come from grasses, trees, and weeds. Year-round allergic rhinitis  (perennial allergic rhinitis) is caused by allergens such as house dust mites, pet dander, and mold spores. SYMPTOMS  Nasal stuffiness (congestion).  Itchy, runny nose with sneezing and tearing of the eyes. DIAGNOSIS Your health care provider can help you determine the allergen or allergens that trigger your symptoms. If you and your health care provider are unable to determine the allergen, skin or blood testing may be used. Your health care provider will diagnose your condition after taking your health history and performing a physical exam. Your health care provider may assess you for other related conditions, such as asthma, pink eye, or an ear infection. TREATMENT Allergic rhinitis does not have a cure, but it can be controlled by:  Medicines that block allergy symptoms. These may include allergy shots, nasal sprays, and oral antihistamines.  Avoiding the allergen. Hay fever may often be treated with antihistamines in pill or nasal spray forms. Antihistamines block the effects of histamine. There are over-the-counter medicines that may help with nasal congestion and swelling around the eyes. Check with your health care provider before taking or giving this medicine. If avoiding the allergen or the medicine prescribed do not work, there are many new medicines your health care provider can prescribe. Stronger medicine may be used if initial measures are ineffective. Desensitizing injections can be used if medicine and avoidance does not work. Desensitization is when a patient is given ongoing shots until the body becomes less sensitive to the allergen. Make sure you follow up with your health care provider if problems continue. HOME CARE INSTRUCTIONS It is not possible to completely avoid  allergens, but you can reduce your symptoms by taking steps to limit your exposure to them. It helps to know exactly what you are allergic to so that you can avoid your specific triggers. SEEK MEDICAL CARE  IF:  You have a fever.  You develop a cough that does not stop easily (persistent).  You have shortness of breath.  You start wheezing.  Symptoms interfere with normal daily activities.   This information is not intended to replace advice given to you by your health care provider. Make sure you discuss any questions you have with your health care provider.   Document Released: 08/22/2001 Document Revised: 12/18/2014 Document Reviewed: 08/04/2013 Elsevier Interactive Patient Education [employer]2016 Elsevier Inc.

## 2016-05-31 ENCOUNTER — Ambulatory Visit (INDEPENDENT_AMBULATORY_CARE_PROVIDER_SITE_OTHER): Payer: BLUE CROSS/BLUE SHIELD

## 2016-05-31 ENCOUNTER — Ambulatory Visit (INDEPENDENT_AMBULATORY_CARE_PROVIDER_SITE_OTHER): Payer: BLUE CROSS/BLUE SHIELD | Admitting: Emergency Medicine

## 2016-05-31 VITALS — BP 118/80 | HR 82 | Temp 98.4°F | Resp 17 | Ht 59.0 in | Wt 140.0 lb

## 2016-05-31 DIAGNOSIS — R059 Cough, unspecified: Secondary | ICD-10-CM

## 2016-05-31 DIAGNOSIS — D72829 Elevated white blood cell count, unspecified: Secondary | ICD-10-CM

## 2016-05-31 DIAGNOSIS — R05 Cough: Secondary | ICD-10-CM | POA: Diagnosis not present

## 2016-05-31 DIAGNOSIS — J029 Acute pharyngitis, unspecified: Secondary | ICD-10-CM | POA: Diagnosis not present

## 2016-05-31 DIAGNOSIS — J301 Allergic rhinitis due to pollen: Secondary | ICD-10-CM | POA: Diagnosis not present

## 2016-05-31 LAB — POCT CBC
GRANULOCYTE PERCENT: 74.1 % (ref 37–80)
HEMATOCRIT: 40.8 % (ref 37.7–47.9)
Hemoglobin: 13.9 g/dL (ref 12.2–16.2)
LYMPH, POC: 2.7 (ref 0.6–3.4)
MCH, POC: 25.9 pg — AB (ref 27–31.2)
MCHC: 34.2 g/dL (ref 31.8–35.4)
MCV: 75.6 fL — AB (ref 80–97)
MID (CBC): 0.9 (ref 0–0.9)
MPV: 6.8 fL (ref 0–99.8)
POC Granulocyte: 10.1 — AB (ref 2–6.9)
POC LYMPH %: 19.6 % (ref 10–50)
POC MID %: 6.3 % (ref 0–12)
Platelet Count, POC: 283 10*3/uL (ref 142–424)
RBC: 5.39 M/uL (ref 4.04–5.48)
RDW, POC: 14.3 %
WBC: 13.6 10*3/uL — AB (ref 4.6–10.2)

## 2016-05-31 LAB — POCT RAPID STREP A (OFFICE): RAPID STREP A SCREEN: NEGATIVE

## 2016-05-31 MED ORDER — AMOXICILLIN 500 MG PO CAPS: 500.0000 mg | ORAL_CAPSULE | Freq: Two times a day (BID) | ORAL | Status: DC

## 2016-05-31 MED ORDER — BENZONATATE 100 MG PO CAPS: 100.0000 mg | ORAL_CAPSULE | Freq: Three times a day (TID) | ORAL | Status: DC | PRN

## 2016-05-31 NOTE — Patient Instructions (Addendum)
   IF you received an x-ray today, you will receive an invoice from [facility]. Please contact [facility] at [phone] with questions or concerns regarding your invoice.   IF you received labwork today, you will receive an invoice from [facility]. Please contact [facility] at [phone] with questions or concerns regarding your invoice.   Our billing staff will not be able to assist you with questions regarding bills from these companies.  You will be contacted with the lab results as soon as they are available. The fastest way to get your results is to activate your My Chart account. Instructions are located on the last page of this paperwork. If you have not heard from us regarding the results in 2 weeks, please contact this office.    Pharyngitis Pharyngitis is redness, pain, and swelling (inflammation) of your pharynx.  CAUSES  Pharyngitis is usually caused by infection. Most of the time, these infections are from viruses (viral) and are part of a cold. However, sometimes pharyngitis is caused by bacteria (bacterial). Pharyngitis can also be caused by allergies. Viral pharyngitis may be spread from person to person by coughing, sneezing, and personal items or utensils (cups, forks, spoons, toothbrushes). Bacterial pharyngitis may be spread from person to person by more intimate contact, such as kissing.  SIGNS AND SYMPTOMS  Symptoms of pharyngitis include:   Sore throat.   Tiredness (fatigue).   Low-grade fever.   Headache.  Joint pain and muscle aches.  Skin rashes.  Swollen lymph nodes.  Plaque-like film on throat or tonsils (often seen with bacterial pharyngitis). DIAGNOSIS  Your health care provider will ask you questions about your illness and your symptoms. Your medical history, along with a physical exam, is often all that is needed to diagnose pharyngitis. Sometimes, a rapid strep test is done. Other lab tests may  also be done, depending on the suspected cause.  TREATMENT  Viral pharyngitis will usually get better in 3-4 days without the use of medicine. Bacterial pharyngitis is treated with medicines that kill germs (antibiotics).  HOME CARE INSTRUCTIONS   Drink enough water and fluids to keep your urine clear or pale yellow.   Only take over-the-counter or prescription medicines as directed by your health care provider:   If you are prescribed antibiotics, make sure you finish them even if you start to feel better.   Do not take aspirin.   Get lots of rest.   Gargle with 8 oz of salt water ( tsp of salt per 1 qt of water) as often as every 1-2 hours to soothe your throat.   Throat lozenges (if you are not at risk for choking) or sprays may be used to soothe your throat. SEEK MEDICAL CARE IF:   You have large, tender lumps in your neck.  You have a rash.  You cough up green, yellow-brown, or bloody spit. SEEK IMMEDIATE MEDICAL CARE IF:   Your neck becomes stiff.  You drool or are unable to swallow liquids.  You vomit or are unable to keep medicines or liquids down.  You have severe pain that does not go away with the use of recommended medicines.  You have trouble breathing (not caused by a stuffy nose). MAKE SURE YOU:   Understand these instructions.  Will watch your condition.  Will get help right away if you are not doing well or get worse.   This information is not intended to replace advice given to you by your health care   provider. Make sure you discuss any questions you have with your health care provider.   Document Released: 11/27/2005 Document Revised: 09/17/2013 Document Reviewed: 08/04/2013 Elsevier Interactive Patient Education 2016 Elsevier Inc.  

## 2016-05-31 NOTE — Progress Notes (Addendum)
Patient ID: Taylor Sanders, female   DOB: 05-23-1976, 39 y.o.   MRN: [id number]    By signing my name below, I, Essence Howell, attest that this documentation has been prepared under the direction and in the presence of Collene Gobble, MD Electronically Signed: Charline Bills, ED Scribe 05/31/2016 at 1:55 PM.  Chief Complaint:  Chief Complaint  Patient presents with  . Cough    sore throat x 2 weeks ago    HPI: Taylor Sanders is a 40 y.o. female who reports to [facility] today complaining of an intermittently productive cough with yellow sputum for the past month. Pt states that cough is more productive at night. She reports associated symptoms of sore throat for the past 2 weeks and chest pain only with coughing. Pt has tried Dimetapp without significant relief. She denies fever and eye watering. Pt's LNMP was 05/15/16.    Past Medical History  Diagnosis Date  . Ganglion cyst   . Allergy    Past Surgical History  Procedure Laterality Date  . Ganglion cyst aspiration     Social History   Social History  . Marital Status: Married    Spouse Name: N/A  . Number of Children: N/A  . Years of Education: N/A   Social History Main Topics  . Smoking status: Never Smoker   . Smokeless tobacco: None  . Alcohol Use: No  . Drug Use: No  . Sexual Activity: Not Asked   Other Topics Concern  . None   Social History Narrative   Family History  Problem Relation Age of Onset  . Arthritis Mother    No Known Allergies Prior to Admission medications   Not on File   ROS: The patient denies fevers, chills, night sweats, unintentional weight loss, palpitations, wheezing, dyspnea on exertion, nausea, vomiting, abdominal pain, dysuria, hematuria, melena, numbness, weakness, or tingling.  All other systems have been reviewed and were otherwise negative with the exception of those mentioned in the HPI and as above.    PHYSICAL EXAM: Filed Vitals:   05/31/16 1044  BP: 118/80  Pulse: 82  Temp: 98.4 F  (36.9 C)  Resp: 17   Body mass index is 28.26 kg/(m^2).  General: Alert, no acute distress HEENT:  Normocephalic, atraumatic, oropharynx patent. Slight redness on the R side of the throat.  Eye: Nonie Hoyer [facility] Cardiovascular: Regular rate and rhythm, no rubs murmurs or gallops. No Carotid bruits, radial pulse intact. No pedal edema.  Respiratory: Clear to auscultation bilaterally. No wheezes, rales, or rhonchi. No cyanosis, no use of accessory musculature Abdominal: No organomegaly, abdomen is soft and non-tender, positive bowel sounds. No masses. Musculoskeletal: Gait intact. No edema, tenderness Skin: No rashes. Neurologic: Facial musculature symmetric. Psychiatric: Patient acts appropriately throughout our interaction. Lymphatic: Mildly tender R anterior cervical node.   LABS: Results for orders placed or performed in visit on 05/31/16  POCT rapid strep A  Result Value Ref Range   Rapid Strep A Screen Negative Negative    Results for orders placed or performed in visit on 05/31/16  POCT rapid strep A  Result Value Ref Range   Rapid Strep A Screen Negative Negative  POCT CBC  Result Value Ref Range   WBC 13.6 (A) 4.6 - 10.2 K/uL   Lymph, poc 2.7 0.6 - 3.4   POC LYMPH PERCENT 19.6 10 - 50 %L   MID (cbc) 0.9 0 - 0.9   POC MID % 6.3 0 - 12 %M   POC Granulocyte 10.1 (  A) 2 - 6.9   Granulocyte percent 74.1 37 - 80 %G   RBC 5.39 4.04 - 5.48 M/uL   Hemoglobin 13.9 12.2 - 16.2 g/dL   HCT, POC 40.940.8 81.137.7 - 47.9 %   MCV 75.6 (A) 80 - 97 fL   MCH, POC 25.9 (A) 27 - 31.2 pg   MCHC 34.2 31.8 - 35.4 g/dL   RDW, POC 91.414.3 %   Platelet Count, POC 283 142 - 424 K/uL   MPV 6.8 0 - 99.8 fL    EKG/XRAY:   [facility] Dg Chest 2 View  05/31/2016  CLINICAL DATA:  Cough for 1 month EXAM: CHEST  2 VIEW COMPARISON:  08/20/2006 FINDINGS: The heart size and mediastinal contours are within normal limits. Both lungs are clear. The visualized skeletal structures  are unremarkable. IMPRESSION: No active cardiopulmonary disease. Electronically Signed   By: Charlett NoseKevin  Dover M.D.   On: 05/31/2016 14:04    ASSESSMENT/PLAN: We'll treat as a lymphadenitis. Her throat was only slightly red. Strep test was negative but she has a tender right anterior cervical node with a white count of 13,600. Throat culture was sent. She will be on amoxicillin 500 twice a day or 10 days.I personally performed the services described in this documentation, which was scribed in my presence. The recorded information has been reviewed and is accurate.    Gross sideeffects, risk and benefits, and alternatives of medications d/w patient. Patient is aware that all medications have potential sideeffects and we are unable to predict every sideeffect or drug-drug interaction that may occur.  Lesle ChrisSteven Beckett Maden MD 05/31/2016 12:58 PM

## 2016-06-02 LAB — CULTURE, GROUP A STREP: Organism ID, Bacteria: NORMAL

## 2016-11-08 IMAGING — DX DG CHEST 2V
2 series · 2 of 2 positions shown · non-contrast
Comparison: 08/20/2006

CLINICAL DATA: Cough for 1 month

EXAM:
CHEST  2 VIEW

[chest pa]
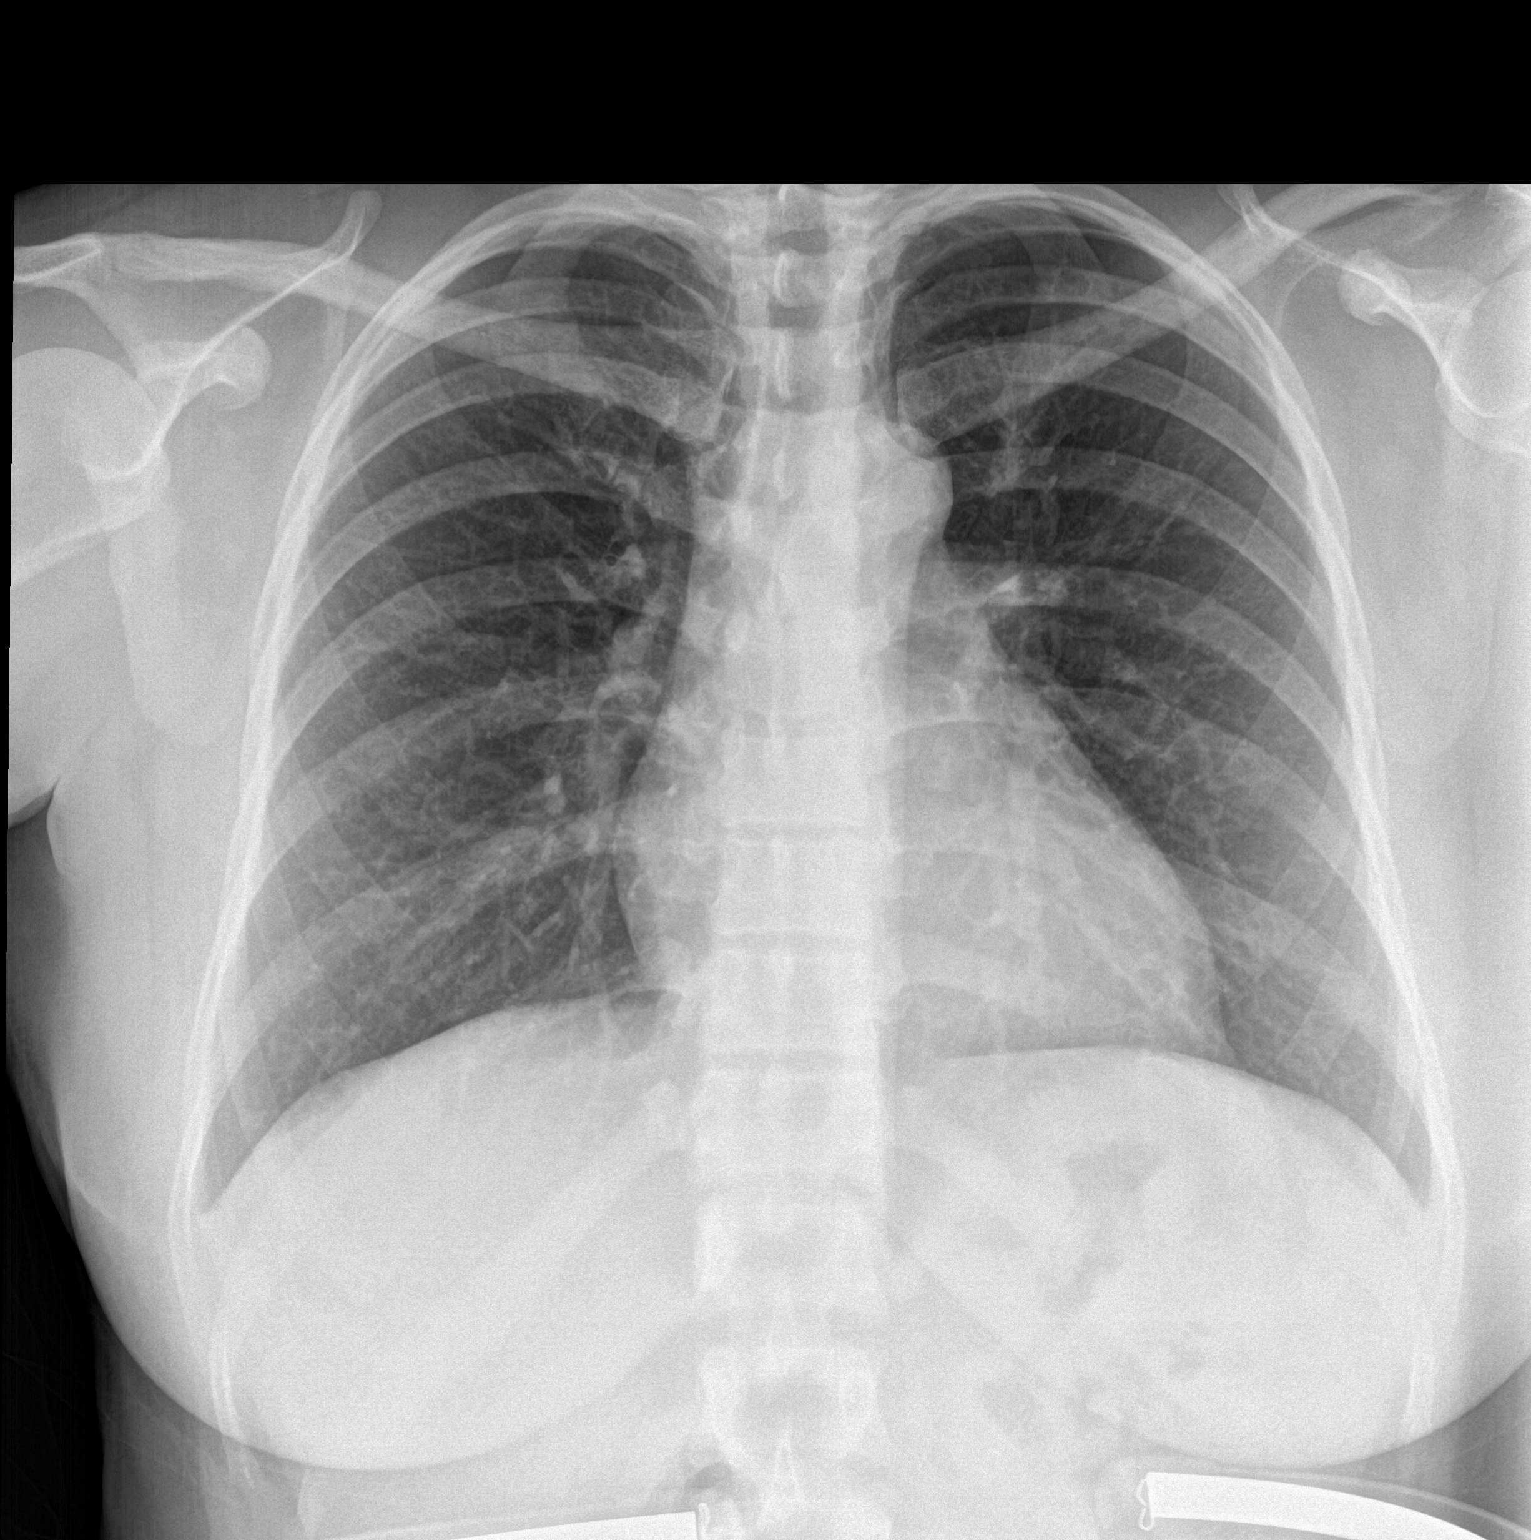

[chest lat]
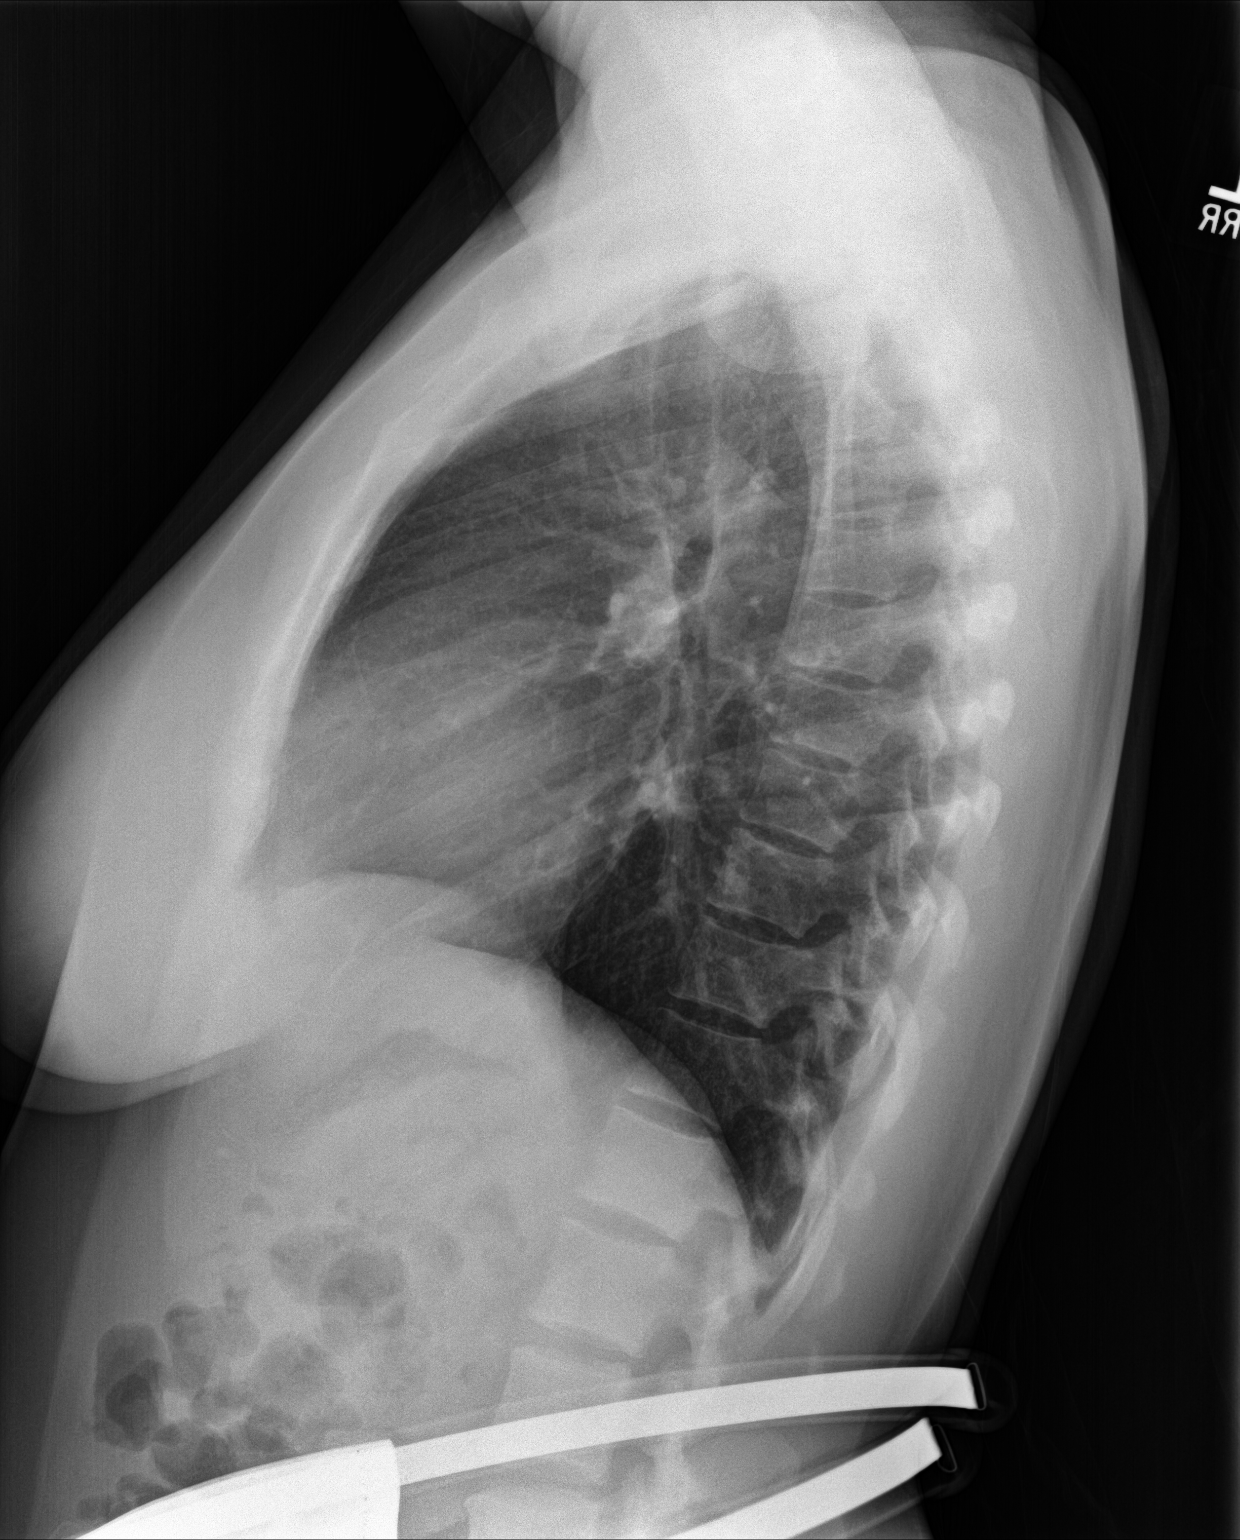

[2 of 2 positions shown; findings below may reference images not displayed]

FINDINGS: The heart size and mediastinal contours are within normal limits.
Both lungs are clear. The visualized skeletal structures are
unremarkable.
IMPRESSION: No active cardiopulmonary disease.

## 2019-09-08 DIAGNOSIS — Z87898 Personal history of other specified conditions: Secondary | ICD-10-CM | POA: Insufficient documentation

## 2019-09-08 DIAGNOSIS — H11001 Unspecified pterygium of right eye: Secondary | ICD-10-CM | POA: Insufficient documentation

## 2020-06-16 ENCOUNTER — Other Ambulatory Visit: Payer: Self-pay

## 2020-06-16 ENCOUNTER — Ambulatory Visit: Payer: BC Managed Care – PPO | Admitting: Podiatry

## 2020-06-16 ENCOUNTER — Ambulatory Visit (INDEPENDENT_AMBULATORY_CARE_PROVIDER_SITE_OTHER): Payer: BC Managed Care – PPO

## 2020-06-16 DIAGNOSIS — M722 Plantar fascial fibromatosis: Secondary | ICD-10-CM

## 2020-06-16 DIAGNOSIS — M79672 Pain in left foot: Secondary | ICD-10-CM

## 2020-06-17 ENCOUNTER — Encounter: Payer: Self-pay | Admitting: Podiatry

## 2020-06-17 NOTE — Progress Notes (Signed)
  Subjective:  Patient ID: Taylor Sanders, female    DOB: 01/06/1976,  MRN: [id number]  Chief Complaint  Patient presents with  . Plantar Fasciitis    Pt states left plantar heel pain, previous diagnosis of plantar fasciitis. Pt states injections have not been effective long term.    44 y.o. female presents with the above complaint.  Patient presents with complaint of left plantar heel pain that has been going on for quite some time.  Patient states that she has had multiple injections in the past and that has not helped for a very long term.  She states that she wants more definitive cure.  She was seen by other podiatrist but has not helped much in the long run.  She was diagnosed by of plantar fasciitis by them.  She denies any other acute complaints.  She states that she does not really get much pain relief and has not tried various different alleviating factor.  She would like to discuss treatment options to keep the plantar fasciitis alert for long-term.   Review of Systems: Negative except as noted in the HPI. Denies N/V/F/Ch.  Past Medical History:  Diagnosis Date  . Allergy   . Ganglion cyst     Current Outpatient Medications:  .  amoxicillin (AMOXIL) 500 MG capsule, Take 1 capsule (500 mg total) by mouth 2 (two) times daily., Disp: 20 capsule, Rfl: 0 .  AUROVELA 24 FE 1-20 MG-MCG(24) tablet, Take 1 tablet by mouth daily., Disp: , Rfl:  .  benzonatate (TESSALON) 100 MG capsule, Take 1-2 capsules (100-200 mg total) by mouth 3 (three) times daily as needed for cough., Disp: 40 capsule, Rfl: 0 .  meloxicam (MOBIC) 15 MG tablet, Take 15 mg by mouth daily., Disp: , Rfl:   Social History   Tobacco Use  Smoking Status Never Smoker    No Known Allergies Objective:  There were no vitals filed for this visit. There is no height or weight on file to calculate BMI. Constitutional Well developed. Well nourished.  Vascular Dorsalis pedis pulses palpable bilaterally. Posterior tibial  pulses palpable bilaterally. Capillary refill normal to all digits.  No cyanosis or clubbing noted. Pedal hair growth normal.  Neurologic Normal speech. Oriented to person, place, and time. Epicritic sensation to light touch grossly present bilaterally.  Dermatologic Nails well groomed and normal in appearance. No open wounds. No skin lesions.  Orthopedic: Normal joint ROM without pain or crepitus bilaterally. No visible deformities. Tender to palpation at the calcaneal tuber left. No pain with calcaneal squeeze left. Ankle ROM full range of motion left. Silfverskiold Test: negative left.   Radiographs: Taken and reviewed. No acute fractures or dislocations. No evidence of stress fracture.  Plantar heel spur present. Posterior heel spur absent.   Assessment:   1. Plantar fasciitis, left   2. Intractable left heel pain    Plan:  Patient was evaluated and treated and all questions answered.  Plantar Fasciitis, left - XR reviewed as above.  - Educated on icing and stretching. Instructions given.  - Injection delivered to the plantar fascia as below. - DME: Plantar Fascial Brace - Pharmacologic management: None  Procedure: Injection Tendon/Ligament Location: Left plantar fascia at the glabrous junction; medial approach. Skin Prep: alcohol Injectate: 0.5 cc 0.5% marcaine plain, 0.5 cc of 1% Lidocaine, 0.5 cc kenalog 10. Disposition: Patient tolerated procedure well. Injection site dressed with a band-aid.  No follow-ups on file.

## 2020-07-23 ENCOUNTER — Encounter: Payer: Self-pay | Admitting: Podiatry

## 2020-07-23 ENCOUNTER — Other Ambulatory Visit: Payer: Self-pay

## 2020-07-23 ENCOUNTER — Ambulatory Visit (INDEPENDENT_AMBULATORY_CARE_PROVIDER_SITE_OTHER): Payer: BC Managed Care – PPO | Admitting: Podiatry

## 2020-07-23 DIAGNOSIS — M7732 Calcaneal spur, left foot: Secondary | ICD-10-CM | POA: Diagnosis not present

## 2020-07-23 DIAGNOSIS — M722 Plantar fascial fibromatosis: Secondary | ICD-10-CM

## 2020-07-27 NOTE — Progress Notes (Signed)
  Subjective:  Patient ID: Taylor Sanders, female    DOB: 1976/05/05,  MRN: [id number]  Chief Complaint  Patient presents with  . Foot Pain    pt is here for a f/u of the left foot.    44 y.o. female presents with the above complaint.  Patient presents with a follow-up of left plantar fasciitis.  Patient states is still hurting.  Patient has worn the plantar fascial brace.  The pain varies.  The pain has improved since before the any current injection of therapy.  Patient states that she has been certain type of preparation activities that may have caused her to have a lot of pain.  Pain scale is 8 out of 10.   Review of Systems: Negative except as noted in the HPI. Denies N/V/F/Ch.  Past Medical History:  Diagnosis Date  . Allergy   . Ganglion cyst     Current Outpatient Medications:  .  amoxicillin (AMOXIL) 500 MG capsule, Take 1 capsule (500 mg total) by mouth 2 (two) times daily., Disp: 20 capsule, Rfl: 0 .  AUROVELA 24 FE 1-20 MG-MCG(24) tablet, Take 1 tablet by mouth daily., Disp: , Rfl:  .  benzonatate (TESSALON) 100 MG capsule, Take 1-2 capsules (100-200 mg total) by mouth 3 (three) times daily as needed for cough., Disp: 40 capsule, Rfl: 0 .  meloxicam (MOBIC) 15 MG tablet, Take 15 mg by mouth daily., Disp: , Rfl:   Social History   Tobacco Use  Smoking Status Never Smoker    No Known Allergies Objective:  There were no vitals filed for this visit. There is no height or weight on file to calculate BMI. Constitutional Well developed. Well nourished.  Vascular Dorsalis pedis pulses palpable bilaterally. Posterior tibial pulses palpable bilaterally. Capillary refill normal to all digits.  No cyanosis or clubbing noted. Pedal hair growth normal.  Neurologic Normal speech. Oriented to person, place, and time. Epicritic sensation to light touch grossly present bilaterally.  Dermatologic Nails well groomed and normal in appearance. No open wounds. No skin lesions.    Orthopedic: Normal joint ROM without pain or crepitus bilaterally. No visible deformities. Tender to palpation at the calcaneal tuber left. No pain with calcaneal squeeze left. Ankle ROM full range of motion left. Silfverskiold Test: negative left.   Radiographs: Taken and reviewed. No acute fractures or dislocations. No evidence of stress fracture.  Plantar heel spur present. Posterior heel spur absent.   Assessment:   No diagnosis found. Plan:  Patient was evaluated and treated and all questions answered.  Plantar Fasciitis, left - XR reviewed as above.  - Educated on icing and stretching. Instructions given.  -Second injection delivered to the plantar fascia as below. - DME: Night splint - Pharmacologic management: None  Procedure: Injection Tendon/Ligament Location: Left plantar fascia at the glabrous junction; medial approach. Skin Prep: alcohol Injectate: 0.5 cc 0.5% marcaine plain, 0.5 cc of 1% Lidocaine, 0.5 cc kenalog 10. Disposition: Patient tolerated procedure well. Injection site dressed with a band-aid.  No follow-ups on file.

## 2020-08-23 ENCOUNTER — Ambulatory Visit: Payer: BC Managed Care – PPO | Admitting: Podiatry

## 2020-08-27 ENCOUNTER — Encounter: Payer: Self-pay | Admitting: Podiatry

## 2020-08-27 ENCOUNTER — Other Ambulatory Visit: Payer: Self-pay

## 2020-08-27 ENCOUNTER — Ambulatory Visit: Payer: BC Managed Care – PPO | Admitting: Podiatry

## 2020-08-27 DIAGNOSIS — M722 Plantar fascial fibromatosis: Secondary | ICD-10-CM

## 2020-08-27 DIAGNOSIS — Q666 Other congenital valgus deformities of feet: Secondary | ICD-10-CM

## 2020-08-27 DIAGNOSIS — M7732 Calcaneal spur, left foot: Secondary | ICD-10-CM | POA: Diagnosis not present

## 2020-09-01 ENCOUNTER — Encounter: Payer: Self-pay | Admitting: Podiatry

## 2020-09-01 NOTE — Progress Notes (Signed)
  Subjective:  Patient ID: Taylor Sanders, female    DOB: [birth year],  MRN: [id number]  Chief Complaint  Patient presents with  . Plantar Fasciitis    the left heel is better and the shot did help and the night splint i could not tell a difference    44 y.o. female presents with the above complaint.  Patient presents with follow-up of left plantar fasciitis.  Patient states that she is completely healed.  She occasionally has a little bit of a pain but overall she is doing much better.  She denies any other acute complaints.  She would like to discuss future treatment options.   Review of Systems: Negative except as noted in the HPI. Denies N/V/F/Ch.  Past Medical History:  Diagnosis Date  . Allergy   . Ganglion cyst     Current Outpatient Medications:  .  amoxicillin (AMOXIL) 500 MG capsule, Take 1 capsule (500 mg total) by mouth 2 (two) times daily., Disp: 20 capsule, Rfl: 0 .  AUROVELA 24 FE 1-20 MG-MCG(24) tablet, Take 1 tablet by mouth daily., Disp: , Rfl:  .  benzonatate (TESSALON) 100 MG capsule, Take 1-2 capsules (100-200 mg total) by mouth 3 (three) times daily as needed for cough., Disp: 40 capsule, Rfl: 0 .  meloxicam (MOBIC) 15 MG tablet, Take 15 mg by mouth daily., Disp: , Rfl:   Social History   Tobacco Use  Smoking Status Never Smoker  Smokeless Tobacco Never Used    No Known Allergies Objective:  There were no vitals filed for this visit. There is no height or weight on file to calculate BMI. Constitutional Well developed. Well nourished.  Vascular Dorsalis pedis pulses palpable bilaterally. Posterior tibial pulses palpable bilaterally. Capillary refill normal to all digits.  No cyanosis or clubbing noted. Pedal hair growth normal.  Neurologic Normal speech. Oriented to person, place, and time. Epicritic sensation to light touch grossly present bilaterally.  Dermatologic Nails well groomed and normal in appearance. No open wounds. No skin lesions.   Orthopedic: Normal joint ROM without pain or crepitus bilaterally. No visible deformities. Tender to palpation at the calcaneal tuber left. No pain with calcaneal squeeze left. Ankle ROM full range of motion left. Silfverskiold Test: negative left.   Radiographs: Taken and reviewed. No acute fractures or dislocations. No evidence of stress fracture.  Plantar heel spur present. Posterior heel spur absent.   Assessment:   1. Plantar fasciitis, left   2. Heel spur, left   3. Pes planovalgus    Plan:  Patient was evaluated and treated and all questions answered.  Plantar Fasciitis, left -Completely resolved with 2 steroid injections as well as splinting.  At this time I discussed with the patient the importance of maintaining and preventing from coming back.  I discussed with the patient the importance of shoe gear modification as well as orthotics.  Given that patient has an underlying pes planovalgus deformity she will benefit from orthotics to take the stress off of the plantar fascia.  Pes planovalgus -I explained to patient the etiology of pes planovalgus and various treatment options were discussed.  I believe patient will benefit from custom-made orthotics to help support the arch of the foot as well as control the hindfoot motion especially to take the stress of the plantar fascia to prevent it from coming back.  Patient states understanding would like to obtain custom-made orthotics -She will be scheduled to see Raiford Noble for custom-made orthotics  Return in about 8 weeks (around 10/22/2020).

## 2020-09-07 ENCOUNTER — Ambulatory Visit (INDEPENDENT_AMBULATORY_CARE_PROVIDER_SITE_OTHER): Payer: BC Managed Care – PPO | Admitting: Orthotics

## 2020-09-07 ENCOUNTER — Other Ambulatory Visit: Payer: Self-pay

## 2020-09-07 DIAGNOSIS — M7732 Calcaneal spur, left foot: Secondary | ICD-10-CM

## 2020-09-07 DIAGNOSIS — M722 Plantar fascial fibromatosis: Secondary | ICD-10-CM

## 2020-09-07 DIAGNOSIS — Q666 Other congenital valgus deformities of feet: Secondary | ICD-10-CM

## 2020-09-07 NOTE — Progress Notes (Signed)

## 2020-10-04 ENCOUNTER — Encounter (INDEPENDENT_AMBULATORY_CARE_PROVIDER_SITE_OTHER): Payer: BC Managed Care – PPO | Admitting: Orthotics

## 2020-10-04 ENCOUNTER — Other Ambulatory Visit: Payer: Self-pay

## 2020-10-18 ENCOUNTER — Ambulatory Visit: Payer: BC Managed Care – PPO | Admitting: Podiatry

## 2020-10-27 ENCOUNTER — Ambulatory Visit: Payer: BC Managed Care – PPO | Admitting: Podiatry

## 2020-12-01 ENCOUNTER — Other Ambulatory Visit: Payer: Self-pay

## 2020-12-01 ENCOUNTER — Ambulatory Visit: Payer: BC Managed Care – PPO | Admitting: Podiatry

## 2020-12-01 DIAGNOSIS — Q666 Other congenital valgus deformities of feet: Secondary | ICD-10-CM

## 2020-12-01 DIAGNOSIS — M722 Plantar fascial fibromatosis: Secondary | ICD-10-CM

## 2020-12-06 ENCOUNTER — Encounter: Payer: Self-pay | Admitting: Podiatry

## 2020-12-06 NOTE — Progress Notes (Signed)
Subjective:  Patient ID: Taylor Sanders, female    DOB: Sep 12, 1976,  MRN: [id number]  Chief Complaint  Patient presents with  . Foot Pain    Pt stated that she is still having pain and that the orthotics are not helping.    44 y.o. female presents with the above complaint.  Patient presents with follow-up of left plantar fasciitis. Patient states the heel pain is still hurting. She states the orthotics are not helping if it is making it worse. She would like to make an adjustment to the orthotics.   Review of Systems: Negative except as noted in the HPI. Denies N/V/F/Ch.  Past Medical History:  Diagnosis Date  . Allergy   . Ganglion cyst     Current Outpatient Medications:  .  amoxicillin (AMOXIL) 500 MG capsule, Take 1 capsule (500 mg total) by mouth 2 (two) times daily., Disp: 20 capsule, Rfl: 0 .  AUROVELA 24 FE 1-20 MG-MCG(24) tablet, Take 1 tablet by mouth daily., Disp: , Rfl:  .  benzonatate (TESSALON) 100 MG capsule, Take 1-2 capsules (100-200 mg total) by mouth 3 (three) times daily as needed for cough., Disp: 40 capsule, Rfl: 0 .  meloxicam (MOBIC) 15 MG tablet, Take 15 mg by mouth daily., Disp: , Rfl:   Social History   Tobacco Use  Smoking Status Never Smoker  Smokeless Tobacco Never Used    No Known Allergies Objective:  There were no vitals filed for this visit. There is no height or weight on file to calculate BMI. Constitutional Well developed. Well nourished.  Vascular Dorsalis pedis pulses palpable bilaterally. Posterior tibial pulses palpable bilaterally. Capillary refill normal to all digits.  No cyanosis or clubbing noted. Pedal hair growth normal.  Neurologic Normal speech. Oriented to person, place, and time. Epicritic sensation to light touch grossly present bilaterally.  Dermatologic Nails well groomed and normal in appearance. No open wounds. No skin lesions.  Orthopedic: Normal joint ROM without pain or crepitus bilaterally. No visible  deformities. Mild tender to palpation at the calcaneal tuber left. No pain with calcaneal squeeze left. Ankle ROM full range of motion left. Silfverskiold Test: negative left.   Radiographs: Taken and reviewed. No acute fractures or dislocations. No evidence of stress fracture.  Plantar heel spur present. Posterior heel spur absent.   Assessment:   1. Plantar fasciitis, left   2. Pes planovalgus    Plan:  Patient was evaluated and treated and all questions answered.  Plantar Fasciitis, left -Completely resolved with 2 steroid injections as well as splinting.  At this time I discussed with the patient the importance of maintaining and preventing from coming back.  I discussed with the patient the importance of shoe gear modification as well as orthotics.  G -Orthotics were dispensed. Patient states the orthotics are hurting in the arches. I discussed with the patient that she will need to break the orthotics in.  Pes planovalgus -I explained to patient the etiology of pes planovalgus and various treatment options were discussed.  I believe patient will benefit from custom-made orthotics to help support the arch of the foot as well as control the hindfoot motion especially to take the stress of the plantar fascia to prevent it from coming back.  Patient states understanding would like to obtain custom-made orthotics -Orthotics are obtained seem to be fitting well however is hurting her in the arch I discussed break-in the orthotics and the importance of doing so. Patient states understanding -Should be rescheduled with Raiford Noble to  make an adjustment to the orthotics  No follow-ups on file.

## 2020-12-27 ENCOUNTER — Other Ambulatory Visit: Payer: BC Managed Care – PPO | Admitting: Orthotics

## 2021-01-11 ENCOUNTER — Ambulatory Visit: Payer: BC Managed Care – PPO | Admitting: Orthotics

## 2021-01-11 ENCOUNTER — Other Ambulatory Visit: Payer: Self-pay

## 2021-01-11 DIAGNOSIS — M722 Plantar fascial fibromatosis: Secondary | ICD-10-CM

## 2021-01-11 NOTE — Progress Notes (Signed)
Added valgus ff wedge to see if that helps her PF condition any better.; also to try them in the boots she says that she experiences not pain.

## 2021-12-23 ENCOUNTER — Ambulatory Visit: Payer: BC Managed Care – PPO | Admitting: Podiatry

## 2022-01-05 ENCOUNTER — Encounter: Payer: Self-pay | Admitting: Podiatry

## 2022-01-05 ENCOUNTER — Other Ambulatory Visit: Payer: Self-pay

## 2022-01-05 ENCOUNTER — Ambulatory Visit: Payer: BC Managed Care – PPO | Admitting: Podiatry

## 2022-01-05 ENCOUNTER — Ambulatory Visit (INDEPENDENT_AMBULATORY_CARE_PROVIDER_SITE_OTHER): Payer: BC Managed Care – PPO

## 2022-01-05 DIAGNOSIS — M722 Plantar fascial fibromatosis: Secondary | ICD-10-CM | POA: Diagnosis not present

## 2022-01-05 MED ORDER — MELOXICAM 15 MG PO TABS: 15.0000 mg | ORAL_TABLET | Freq: Every day | ORAL | 2 refills | Status: AC

## 2022-01-09 ENCOUNTER — Telehealth: Payer: Self-pay | Admitting: Podiatry

## 2022-01-09 NOTE — Telephone Encounter (Signed)
Patient husband called - he wants to speak with Dr Allena Katz  - patient agrees to physical therapy,at this point surgery is not a option. They need a note stating that she can return wearing her safety shoes not the boot.  They would also like to discuss possible light duty restrictions, possible sit down restrictions , but if they can not comply with those restrictions, then she will have to return to work as normal. (Per husband they can not afford for her to miss work.)

## 2022-01-09 NOTE — Telephone Encounter (Signed)
Patient is not allowed to wear boot to work - they want another course of action because the patient can not take off work.  Needs this addressed as soon as possible.

## 2022-01-10 ENCOUNTER — Encounter: Payer: Self-pay | Admitting: Podiatry

## 2022-01-10 NOTE — Telephone Encounter (Signed)
Patient and her husband called back requesting a note to go back to work without restrictions.

## 2022-01-10 NOTE — Progress Notes (Signed)
Subjective:  Patient ID: Taylor Sanders, female    DOB: [birth year],  MRN: [id number]  Chief Complaint  Patient presents with   Plantar Fasciitis    Left foot pain     46 y.o. female presents with the above complaint.  Patient presents with follow-up of left plantar fasciitis. Patient states the heel pain is still hurting. She states the orthotics are not helping if it is making it worse.  She states that she would like to discuss other plans.   Review of Systems: Negative except as noted in the HPI. Denies N/V/F/Ch.  Past Medical History:  Diagnosis Date   Allergy    Ganglion cyst     Current Outpatient Medications:    meloxicam (MOBIC) 15 MG tablet, Take 1 tablet (15 mg total) by mouth daily., Disp: 30 tablet, Rfl: 2   amoxicillin (AMOXIL) 500 MG capsule, Take 1 capsule (500 mg total) by mouth 2 (two) times daily., Disp: 20 capsule, Rfl: 0   AUROVELA 24 FE 1-20 MG-MCG(24) tablet, Take 1 tablet by mouth daily., Disp: , Rfl:    benzonatate (TESSALON) 100 MG capsule, Take 1-2 capsules (100-200 mg total) by mouth 3 (three) times daily as needed for cough., Disp: 40 capsule, Rfl: 0   meloxicam (MOBIC) 15 MG tablet, Take 15 mg by mouth daily., Disp: , Rfl:   Social History   Tobacco Use  Smoking Status Never  Smokeless Tobacco Never    No Known Allergies Objective:  There were no vitals filed for this visit. There is no height or weight on file to calculate BMI. Constitutional Well developed. Well nourished.  Vascular Dorsalis pedis pulses palpable bilaterally. Posterior tibial pulses palpable bilaterally. Capillary refill normal to all digits.  No cyanosis or clubbing noted. Pedal hair growth normal.  Neurologic Normal speech. Oriented to person, place, and time. Epicritic sensation to light touch grossly present bilaterally.  Dermatologic Nails well groomed and normal in appearance. No open wounds. No skin lesions.  Orthopedic: Normal joint ROM without pain or crepitus  bilaterally. No visible deformities. tender to palpation at the calcaneal tuber left. No pain with calcaneal squeeze left. Ankle ROM full range of motion left. Silfverskiold Test: negative left.   Radiographs: Taken and reviewed. No acute fractures or dislocations. No evidence of stress fracture.  Plantar heel spur present. Posterior heel spur absent.   Assessment:   1. Plantar fasciitis, left    Plan:  Patient was evaluated and treated and all questions answered.  Plantar Fasciitis, left~recurrence -Given that patient's pain has recurred and is now being resolved with steroid injection I believe patient will benefit from cam boot immobilization.  Patient agrees with the plan like to proceed with cam boot immobilization -Cam boot was dispensed -Orthotics were dispensed. Patient states the orthotics are hurting in the arches. I discussed with the patient that she will need to break the orthotics in.  Pes planovalgus -I explained to patient the etiology of pes planovalgus and various treatment options were discussed.  I believe patient will benefit from custom-made orthotics to help support the arch of the foot as well as control the hindfoot motion especially to take the stress of the plantar fascia to prevent it from coming back.  Patient states understanding would like to obtain custom-made orthotics -Orthotics are obtained seem to be fitting well however is hurting her in the arch I discussed break-in the orthotics and the importance of doing so. Patient states understanding -Should be rescheduled with Liliane Channel to make an adjustment  to the orthotics  No follow-ups on file.

## 2022-02-03 ENCOUNTER — Ambulatory Visit: Payer: BC Managed Care – PPO | Admitting: Podiatry

## 2022-06-26 ENCOUNTER — Other Ambulatory Visit: Payer: Self-pay | Admitting: Podiatry

## 2022-06-26 DIAGNOSIS — M722 Plantar fascial fibromatosis: Secondary | ICD-10-CM

## 2022-10-06 ENCOUNTER — Encounter: Payer: Self-pay | Admitting: Internal Medicine

## 2022-10-06 ENCOUNTER — Ambulatory Visit (INDEPENDENT_AMBULATORY_CARE_PROVIDER_SITE_OTHER): Payer: BC Managed Care – PPO | Admitting: Internal Medicine

## 2022-10-06 VITALS — BP 110/70 | HR 60 | Ht 59.0 in | Wt 140.0 lb

## 2022-10-06 DIAGNOSIS — E221 Hyperprolactinemia: Secondary | ICD-10-CM | POA: Diagnosis not present

## 2022-10-06 DIAGNOSIS — N911 Secondary amenorrhea: Secondary | ICD-10-CM | POA: Diagnosis not present

## 2022-10-06 NOTE — Progress Notes (Unsigned)
Name: Taylor Sanders  MRN/ DOB: [id number], [birth year]    Age/ Sex: 46 y.o., female    PCP: Patient, No Pcp Per   Reason for Endocrinology Evaluation: Hyperprolactinemia     Date of Initial Endocrinology Evaluation: 10/06/2022     HPI: Ms. Taylor Sanders is a 46 y.o. female with  past medical history DM  . The patient presented for initial endocrinology clinic visit on 10/06/2022 for consultative assistance with her high prolactinemia.     Ms. Taylor Sanders is accompanied by her spouse today Pt has been noted with elevated prolactin at 29.1 in 09/2021   Denies galactorrhea  She denies OCPs'  Denies headaches  Has noted change in vision, last eye exam this year  She has hot flashes  Uses occasional antacid  She has 1 child ~16 yrs   LMP was 04/2021 ,prior to that had regular menstruations  NO Fh of breast or ovarian  Denies migraines headaches     HISTORY:  Past Medical History:  Past Medical History:  Diagnosis Date   Allergy    Ganglion cyst    Past Surgical History:  Past Surgical History:  Procedure Laterality Date   ganglion cyst aspiration      Social History:  reports that she has never smoked. She has never used smokeless tobacco. She reports that she does not drink alcohol and does not use drugs. Family History: family history includes Arthritis in her mother.   HOME MEDICATIONS: Allergies as of 10/06/2022   No Known Allergies      Medication List        Accurate as of October 06, 2022  2:53 PM. If you have any questions, ask your nurse or doctor.          STOP taking these medications    amoxicillin 500 MG capsule Commonly known as: AMOXIL Stopped by: Scarlette Shorts, MD   benzonatate 100 MG capsule Commonly known as: TESSALON Stopped by: Scarlette Shorts, MD       TAKE these medications    atorvastatin 40 MG tablet Commonly known as: LIPITOR Take 40 mg by mouth daily.   Aurovela 24 FE 1-20 MG-MCG(24) tablet Generic drug:  Norethindrone Acetate-Ethinyl Estrad-FE Take 1 tablet by mouth daily.   meloxicam 15 MG tablet Commonly known as: MOBIC Take 1 tablet (15 mg total) by mouth daily. What changed: Another medication with the same name was removed. Continue taking this medication, and follow the directions you see here. Changed by: Scarlette Shorts, MD   metFORMIN 500 MG 24 hr tablet Commonly known as: GLUCOPHAGE-XR Take 500 mg by mouth daily.   multivitamin tablet Take 1 tablet by mouth daily.          REVIEW OF SYSTEMS: A comprehensive ROS was conducted with the patient and is negative except as per HPI     OBJECTIVE:  VS: BP 110/70 (BP Location: Left Arm, Patient Position: Sitting, Cuff Size: Small)   Pulse 60   Ht 4\' 11"  (1.499 m)   Wt 140 lb (63.5 kg)   SpO2 99%   BMI 28.28 kg/m    Wt Readings from Last 3 Encounters:  10/06/22 140 lb (63.5 kg)  05/31/16 140 lb (63.5 kg)  02/18/16 141 lb 12.8 oz (64.3 kg)     EXAM: General: Pt appears well and is in NAD  Eyes: External eye exam normal without stare, lid lag or exophthalmos.  EOM intact.   Neck: General: Supple without adenopathy. Thyroid: Thyroid  size normal.  No goiter or nodules appreciated.  Hyperprolactinemia  Lungs: Clear with good BS bilat with no rales, rhonchi, or wheezes  Heart: Auscultation: RRR.  Abdomen: Normoactive bowel sounds, soft, nontender, without masses or organomegaly palpable  Extremities:  BL LE: No pretibial edema normal ROM and strength.  Mental Status: Judgment, insight: Intact Orientation: Oriented to time, place, and person Mood and affect: No depression, anxiety, or agitation     DATA REVIEWED: ***    ASSESSMENT/PLAN/RECOMMENDATIONS:   Hyperprolactinemia:  -Unknown cause at this time -Differential include assay interference versus pituitary microadenoma with the latter being less likely - Prolactin level today     2.  Secondary amenorrhea:  -She has not had any menstruations  since May 2022 -She was seen by GYN in October 2022, the only test I see through them as the prolactin  Signed electronically by: Mack Guise, MD  [facility]  [address] [place], [place] [address] Phone: [phone] FAX: [phone]   CC: Patient, No Pcp Per No address on file Phone: None Fax: None   Return to Endocrinology clinic as below: No future appointments.

## 2022-10-07 LAB — LUTEINIZING HORMONE: LH: 41.9 m[IU]/mL

## 2022-10-07 LAB — TSH: TSH: 2.41 u[IU]/mL (ref 0.450–4.500)

## 2022-10-07 LAB — PROLACTIN: Prolactin: 11.9 ng/mL (ref 4.8–23.3)

## 2022-10-07 LAB — T4, FREE: Free T4: 1.02 ng/dL (ref 0.82–1.77)

## 2022-10-07 LAB — ESTRADIOL: Estradiol: 5.6 pg/mL

## 2022-10-07 LAB — FOLLICLE STIMULATING HORMONE: FSH: 107 m[IU]/mL

## 2022-10-09 ENCOUNTER — Telehealth: Payer: Self-pay | Admitting: Internal Medicine

## 2022-10-09 NOTE — Telephone Encounter (Signed)
Left a message for the patient to discuss lab results on 10/09/2022 at 1430    Awaiting callback

## 2022-10-17 ENCOUNTER — Telehealth: Payer: Self-pay

## 2022-10-17 NOTE — Telephone Encounter (Signed)
Patient spouse left vm returning call about lab results.  CB: [phone]

## 2022-10-17 NOTE — Telephone Encounter (Signed)
Patient says that he has given all insurance information to billing and they have taken care of the problem.

## 2022-10-17 NOTE — Telephone Encounter (Signed)
Patient's spouse, Brielle Moro, is calling for lab results from patient's las visit with Dr. Kelton Pillar.

## 2022-10-17 NOTE — Telephone Encounter (Signed)
Patient spouse left a vm regarding insurance and billing information. Wanted to make sure that you have insurance on file. Received a bill information that says no insurance.

## 2022-10-18 NOTE — Telephone Encounter (Signed)
Spoke to Mr. Bohne on 10/18/2022 and explained normalization of prolactin level and that the patient is in post menopause   I have recommended hormone replacement therapy since she is young until age 46   I will defer HRT to her gynecologist, the patient has been prescribed combined oral contraceptive pills by GYN but she did not start this

## 2022-11-15 ENCOUNTER — Telehealth: Payer: Self-pay

## 2022-11-15 NOTE — Telephone Encounter (Signed)
Lab results have been faxed to Dr. Amado Nash at [phone].

## 2023-07-28 ENCOUNTER — Encounter (HOSPITAL_COMMUNITY): Payer: Self-pay

## 2023-07-28 ENCOUNTER — Other Ambulatory Visit: Payer: Self-pay

## 2023-07-28 ENCOUNTER — Emergency Department (HOSPITAL_COMMUNITY)
Admission: EM | Admit: 2023-07-28 | Discharge: 2023-07-29 | Disposition: A | Discharge status: Home / Self Care | Payer: BC Managed Care – PPO | Attending: Emergency Medicine | Admitting: Emergency Medicine

## 2023-07-28 DIAGNOSIS — R0989 Other specified symptoms and signs involving the circulatory and respiratory systems: Secondary | ICD-10-CM | POA: Insufficient documentation

## 2023-07-28 DIAGNOSIS — R09A2 Foreign body sensation, throat: Secondary | ICD-10-CM

## 2023-07-28 NOTE — ED Triage Notes (Signed)
Pt. Arrives stating that for the past 3 weeks, she has felt like there is something stuck in her throat. She states that she was seen at [facility], but they told her that everything was fine. She also feel as if it could possibly be spit stuck in her throat, but she is unable to. Pt. Endorses SOB. There is no hoarseness in voice.

## 2023-07-29 LAB — CBC WITH DIFFERENTIAL/PLATELET
Abs Immature Granulocytes: 0.01 10*3/uL (ref 0.00–0.07)
Basophils Absolute: 0.1 10*3/uL (ref 0.0–0.1)
Basophils Relative: 1 %
Eosinophils Absolute: 0.1 10*3/uL (ref 0.0–0.5)
Eosinophils Relative: 2 %
HCT: 40.8 % (ref 36.0–46.0)
Hemoglobin: 12.8 g/dL (ref 12.0–15.0)
Immature Granulocytes: 0 %
Lymphocytes Relative: 44 %
Lymphs Abs: 3.6 10*3/uL (ref 0.7–4.0)
MCH: 24.2 pg — ABNORMAL LOW (ref 26.0–34.0)
MCHC: 31.4 g/dL (ref 30.0–36.0)
MCV: 77.3 fL — ABNORMAL LOW (ref 80.0–100.0)
Monocytes Absolute: 0.6 10*3/uL (ref 0.1–1.0)
Monocytes Relative: 8 %
Neutro Abs: 3.7 10*3/uL (ref 1.7–7.7)
Neutrophils Relative %: 45 %
Platelets: 288 10*3/uL (ref 150–400)
RBC: 5.28 MIL/uL — ABNORMAL HIGH (ref 3.87–5.11)
RDW: 14.1 % (ref 11.5–15.5)
WBC: 8.1 10*3/uL (ref 4.0–10.5)
nRBC: 0 % (ref 0.0–0.2)

## 2023-07-29 LAB — COMPREHENSIVE METABOLIC PANEL
ALT: 27 U/L (ref 0–44)
AST: 28 U/L (ref 15–41)
Albumin: 4.3 g/dL (ref 3.5–5.0)
Alkaline Phosphatase: 126 U/L (ref 38–126)
Anion gap: 10 (ref 5–15)
BUN: 13 mg/dL (ref 6–20)
CO2: 23 mmol/L (ref 22–32)
Calcium: 9.6 mg/dL (ref 8.9–10.3)
Chloride: 106 mmol/L (ref 98–111)
Creatinine, Ser: 0.73 mg/dL (ref 0.44–1.00)
GFR, Estimated: >60 mL/min (ref >60)
Glucose, Bld: 104 mg/dL — ABNORMAL HIGH (ref 70–99)
Potassium: 3.2 mmol/L — ABNORMAL LOW (ref 3.5–5.1)
Sodium: 139 mmol/L (ref 135–145)
Total Bilirubin: 0.6 mg/dL (ref 0.3–1.2)
Total Protein: 7.7 g/dL (ref 6.5–8.1)

## 2023-07-29 MED ORDER — ALUM & MAG HYDROXIDE-SIMETH 200-200-20 MG/5ML PO SUSP
30.0000 mL | Freq: Once | ORAL | Status: AC
  Administered 2023-07-29: 30 mL via ORAL
  Filled 2023-07-29: qty 30

## 2023-07-29 MED ORDER — LIDOCAINE VISCOUS HCL 2 % MT SOLN
15.0000 mL | Freq: Once | OROMUCOSAL | Status: AC
  Administered 2023-07-29: 15 mL via ORAL
  Filled 2023-07-29: qty 15

## 2023-07-29 MED ORDER — OMEPRAZOLE 20 MG PO CPDR: 20.0000 mg | DELAYED_RELEASE_CAPSULE | Freq: Every day | ORAL | 0 refills | Status: AC

## 2023-07-29 NOTE — ED Provider Notes (Signed)
[place] EMERGENCY DEPARTMENT AT [facility] Provider Note  CSN: [id number] Arrival date & time: 07/28/23 2257  Chief Complaint(s) globus sensation  HPI Taylor Sanders is a 47 y.o. female here for globus sensation for several weeks intermittently. Mostly at night while trying to sleep. At times all day long. Worse with swallowing. Report acid reflux after eating certain spicy foods. No recent fever, cough, congestion, pharyngitis, etc. Reports that she spit up streaky blood in clear sputum today making her concerned.   HPI  Past Medical History Past Medical History:  Diagnosis Date   Allergy    Ganglion cyst    Patient Active Problem List   Diagnosis Date Noted   History of abnormal mammogram 09/08/2019   Pterygium, right 09/08/2019   Home Medication(s) Prior to Admission medications   Medication Sig Start Date End Date Taking? Authorizing Provider  omeprazole (PRILOSEC) 20 MG capsule Take 1 capsule (20 mg total) by mouth daily. 07/29/23  Yes Avanell Banwart, Amadeo Garnet, MD  atorvastatin (LIPITOR) 40 MG tablet Take 40 mg by mouth daily. 09/19/22   [provider]  AUROVELA 24 FE 1-20 MG-MCG(24) tablet Take 1 tablet by mouth daily. Patient not taking: Reported on 10/06/2022 05/30/20   [provider]  meloxicam (MOBIC) 15 MG tablet Take 1 tablet (15 mg total) by mouth daily. 01/05/22   Candelaria Stagers, DPM  metFORMIN (GLUCOPHAGE-XR) 500 MG 24 hr tablet Take 500 mg by mouth daily. 09/19/22   [provider]  Multiple Vitamin (MULTIVITAMIN) tablet Take 1 tablet by mouth daily.    [provider]                                                                                                                                    Allergies Patient has no known allergies.  Review of Systems Review of Systems As noted in HPI  Physical Exam Vital Signs  I have reviewed the triage vital signs BP 139/88   Pulse 60   Temp 97.8 F (36.6 C) (Oral)    Resp 18   SpO2 100%   Physical Exam Vitals reviewed.  Constitutional:      General: She is not in acute distress.    Appearance: She is well-developed. She is not diaphoretic.  HENT:     Head: Normocephalic and atraumatic.     Right Ear: External ear normal.     Left Ear: External ear normal.     Nose: Nose normal.     Mouth/Throat:     Palate: No lesions.     Pharynx: Oropharynx is clear. No pharyngeal swelling, posterior oropharyngeal erythema or uvula swelling.     Tonsils: No tonsillar exudate.  Eyes:     General: No scleral icterus.    Conjunctiva/sclera: Conjunctivae normal.  Neck:     Thyroid: No thyroid mass or thyromegaly.     Trachea: Phonation normal.  Cardiovascular:  Rate and Rhythm: Normal rate and regular rhythm.  Pulmonary:     Effort: Pulmonary effort is normal. No tachypnea or respiratory distress.     Breath sounds: No stridor. No wheezing.  Abdominal:     General: There is no distension.  Musculoskeletal:        General: Normal range of motion.     Cervical back: Normal range of motion.  Lymphadenopathy:     Cervical: No cervical adenopathy.  Neurological:     Mental Status: She is alert and oriented to person, place, and time.  Psychiatric:        Behavior: Behavior normal.     ED Results and Treatments Labs (all labs ordered are listed, but only abnormal results are displayed) Labs Reviewed  CBC WITH DIFFERENTIAL/PLATELET - Abnormal; Notable for the following components:      Result Value   RBC 5.28 (*)    MCV 77.3 (*)    MCH 24.2 (*)    All other components within normal limits  COMPREHENSIVE METABOLIC PANEL - Abnormal; Notable for the following components:   Potassium 3.2 (*)    Glucose, Bld 104 (*)    All other components within normal limits                                                                                                                         EKG  EKG Interpretation Date/Time:    Ventricular Rate:    PR  Interval:    QRS Duration:    QT Interval:    QTC Calculation:   R Axis:      Text Interpretation:         Radiology No results found.  Medications Ordered in ED Medications  alum & mag hydroxide-simeth (MAALOX/MYLANTA) 200-200-20 MG/5ML suspension 30 mL (30 mLs Oral Given 07/29/23 0402)    And  lidocaine (XYLOCAINE) 2 % viscous mouth solution 15 mL (15 mLs Oral Given 07/29/23 0403)   Procedures Procedures  (including critical care time) Medical Decision Making / ED Course   Medical Decision Making Amount and/or Complexity of Data Reviewed Labs: ordered. Decision-making details documented in ED Course.  Risk OTC drugs.    Globus sensation. Ddx below No respiratory distress.  Patient tolerating secretions. CBC without leukocytosis or anemia.  Metabolic panel without significant electrolyte derangements or renal sufficiency. No palpable mass or thyromegaly. No signs of PTA.  Doubt epiglottitis. Given history of acid reflux, this may be contributory factor. Recommended antacids. ENT follow-up if symptoms do not resolve.    Final Clinical Impression(s) / ED Diagnoses Final diagnoses:  Globus sensation   The patient appears reasonably screened and/or stabilized for discharge and I doubt any other medical condition or other [facility] requiring further screening, evaluation, or treatment in the ED at this time. I have discussed the findings, Dx and Tx plan with the patient/family who expressed understanding and agree(s) with the plan. Discharge instructions discussed at length. The patient/family was given strict return  precautions who verbalized understanding of the instructions. No further questions at time of discharge.  Disposition: Discharge  Condition: Good  ED Discharge Orders          Ordered    omeprazole (PRILOSEC) 20 MG capsule  Daily        07/29/23 0344              Follow Up: Primary care provider   to schedule an appointment for close follow  up  Christia Reading, MD [address] [place] [address] [phone]  Call  as needed    This chart was dictated using voice recognition software.  Despite best efforts to proofread,  errors can occur which can change the documentation meaning.    Nira Conn, MD 07/29/23 [phone]
